# Patient Record
Sex: Female | Born: 1950 | Race: White | Hispanic: No | Marital: Married | Smoking: Never smoker
Health system: Southern US, Community
[De-identification: ages and names within clinical notes are randomized; demographics above are authoritative.]

## PROBLEM LIST (undated history)

## (undated) DIAGNOSIS — M8580 Other specified disorders of bone density and structure, unspecified site: Secondary | ICD-10-CM

## (undated) DIAGNOSIS — D649 Anemia, unspecified: Secondary | ICD-10-CM

## (undated) DIAGNOSIS — K219 Gastro-esophageal reflux disease without esophagitis: Secondary | ICD-10-CM

## (undated) DIAGNOSIS — E785 Hyperlipidemia, unspecified: Secondary | ICD-10-CM

## (undated) DIAGNOSIS — I6529 Occlusion and stenosis of unspecified carotid artery: Secondary | ICD-10-CM

## (undated) HISTORY — PX: TUBAL LIGATION: SHX77

## (undated) HISTORY — DX: Gastro-esophageal reflux disease without esophagitis: K21.9

## (undated) HISTORY — DX: Other specified disorders of bone density and structure, unspecified site: M85.80

## (undated) HISTORY — DX: Hyperlipidemia, unspecified: E78.5

## (undated) HISTORY — DX: Occlusion and stenosis of unspecified carotid artery: I65.29

## (undated) HISTORY — DX: Anemia, unspecified: D64.9

---

## 1998-05-03 ENCOUNTER — Other Ambulatory Visit: Admission: RE | Admit: 1998-05-03 | Discharge: 1998-05-03 | Payer: Self-pay | Admitting: Gynecology

## 1999-05-06 ENCOUNTER — Other Ambulatory Visit: Admission: RE | Admit: 1999-05-06 | Discharge: 1999-05-06 | Payer: Self-pay | Admitting: Gynecology

## 2000-04-02 ENCOUNTER — Encounter: Admission: RE | Admit: 2000-04-02 | Discharge: 2000-04-02 | Payer: Self-pay | Admitting: Gynecology

## 2000-04-02 ENCOUNTER — Encounter: Payer: Self-pay | Admitting: Gynecology

## 2000-07-14 ENCOUNTER — Other Ambulatory Visit: Admission: RE | Admit: 2000-07-14 | Discharge: 2000-07-14 | Payer: Self-pay | Admitting: Gynecology

## 2001-04-05 ENCOUNTER — Encounter: Payer: Self-pay | Admitting: Gynecology

## 2001-04-05 ENCOUNTER — Encounter: Admission: RE | Admit: 2001-04-05 | Discharge: 2001-04-05 | Payer: Self-pay | Admitting: Gynecology

## 2001-08-23 ENCOUNTER — Other Ambulatory Visit: Admission: RE | Admit: 2001-08-23 | Discharge: 2001-08-23 | Payer: Self-pay | Admitting: Gynecology

## 2002-03-15 ENCOUNTER — Encounter: Payer: Self-pay | Admitting: Internal Medicine

## 2002-03-15 ENCOUNTER — Ambulatory Visit (HOSPITAL_COMMUNITY): Admission: RE | Admit: 2002-03-15 | Discharge: 2002-03-15 | Payer: Self-pay | Admitting: Internal Medicine

## 2002-04-01 ENCOUNTER — Encounter: Admission: RE | Admit: 2002-04-01 | Discharge: 2002-04-01 | Payer: Self-pay | Admitting: Internal Medicine

## 2002-04-01 ENCOUNTER — Encounter: Payer: Self-pay | Admitting: Internal Medicine

## 2002-04-07 ENCOUNTER — Encounter: Payer: Self-pay | Admitting: Gynecology

## 2002-04-07 ENCOUNTER — Encounter: Admission: RE | Admit: 2002-04-07 | Discharge: 2002-04-07 | Payer: Self-pay | Admitting: Gynecology

## 2002-08-25 ENCOUNTER — Other Ambulatory Visit: Admission: RE | Admit: 2002-08-25 | Discharge: 2002-08-25 | Payer: Self-pay | Admitting: Gynecology

## 2003-02-28 ENCOUNTER — Other Ambulatory Visit: Admission: RE | Admit: 2003-02-28 | Discharge: 2003-02-28 | Payer: Self-pay | Admitting: Gynecology

## 2003-04-10 ENCOUNTER — Encounter: Admission: RE | Admit: 2003-04-10 | Discharge: 2003-04-10 | Payer: Self-pay | Admitting: Gynecology

## 2003-04-10 ENCOUNTER — Encounter: Payer: Self-pay | Admitting: Gynecology

## 2003-08-29 ENCOUNTER — Other Ambulatory Visit: Admission: RE | Admit: 2003-08-29 | Discharge: 2003-08-29 | Payer: Self-pay | Admitting: Gynecology

## 2004-04-10 ENCOUNTER — Encounter: Admission: RE | Admit: 2004-04-10 | Discharge: 2004-04-10 | Payer: Self-pay | Admitting: Gynecology

## 2004-07-30 ENCOUNTER — Ambulatory Visit: Payer: Self-pay | Admitting: Internal Medicine

## 2004-08-21 ENCOUNTER — Ambulatory Visit: Payer: Self-pay | Admitting: Internal Medicine

## 2004-08-29 ENCOUNTER — Other Ambulatory Visit: Admission: RE | Admit: 2004-08-29 | Discharge: 2004-08-29 | Payer: Self-pay | Admitting: Gynecology

## 2004-09-23 ENCOUNTER — Ambulatory Visit: Payer: Self-pay | Admitting: Internal Medicine

## 2004-09-27 ENCOUNTER — Ambulatory Visit: Payer: Self-pay | Admitting: Internal Medicine

## 2005-02-18 ENCOUNTER — Ambulatory Visit: Payer: Self-pay | Admitting: Internal Medicine

## 2005-02-25 ENCOUNTER — Ambulatory Visit: Payer: Self-pay | Admitting: Internal Medicine

## 2005-04-22 ENCOUNTER — Encounter: Admission: RE | Admit: 2005-04-22 | Discharge: 2005-04-22 | Payer: Self-pay | Admitting: Gynecology

## 2005-05-26 ENCOUNTER — Ambulatory Visit: Payer: Self-pay | Admitting: Internal Medicine

## 2005-06-16 ENCOUNTER — Ambulatory Visit: Payer: Self-pay | Admitting: Internal Medicine

## 2005-06-24 ENCOUNTER — Ambulatory Visit: Payer: Self-pay | Admitting: Internal Medicine

## 2005-07-02 ENCOUNTER — Ambulatory Visit: Payer: Self-pay | Admitting: Internal Medicine

## 2005-07-04 ENCOUNTER — Ambulatory Visit: Payer: Self-pay | Admitting: Internal Medicine

## 2005-08-27 ENCOUNTER — Ambulatory Visit: Payer: Self-pay | Admitting: Internal Medicine

## 2005-09-12 ENCOUNTER — Ambulatory Visit: Payer: Self-pay | Admitting: Internal Medicine

## 2005-09-15 ENCOUNTER — Other Ambulatory Visit: Admission: RE | Admit: 2005-09-15 | Discharge: 2005-09-15 | Payer: Self-pay | Admitting: Gynecology

## 2006-02-17 ENCOUNTER — Ambulatory Visit: Payer: Self-pay | Admitting: Internal Medicine

## 2006-02-25 ENCOUNTER — Ambulatory Visit: Payer: Self-pay | Admitting: Internal Medicine

## 2006-04-23 ENCOUNTER — Encounter: Admission: RE | Admit: 2006-04-23 | Discharge: 2006-04-23 | Payer: Self-pay | Admitting: Gynecology

## 2006-04-27 ENCOUNTER — Ambulatory Visit: Payer: Self-pay | Admitting: Internal Medicine

## 2006-06-18 ENCOUNTER — Ambulatory Visit: Payer: Self-pay | Admitting: Internal Medicine

## 2006-07-01 ENCOUNTER — Ambulatory Visit: Payer: Self-pay | Admitting: Internal Medicine

## 2006-09-23 ENCOUNTER — Ambulatory Visit: Payer: Self-pay | Admitting: Internal Medicine

## 2006-11-20 ENCOUNTER — Ambulatory Visit: Payer: Self-pay | Admitting: Internal Medicine

## 2007-02-24 ENCOUNTER — Ambulatory Visit: Payer: Self-pay | Admitting: Internal Medicine

## 2007-02-24 LAB — CONVERTED CEMR LAB
ALT: 35 units/L (ref 0–40)
Basophils Relative: 1 % (ref 0.0–1.0)
Bilirubin, Direct: 0.1 mg/dL (ref 0.0–0.3)
CO2: 29 meq/L (ref 19–32)
Creatinine, Ser: 0.6 mg/dL (ref 0.4–1.2)
Eosinophils Relative: 1.4 % (ref 0.0–5.0)
GFR calc Af Amer: 133 mL/min
Glucose, Bld: 94 mg/dL (ref 70–99)
HCT: 41.4 % (ref 36.0–46.0)
Hemoglobin: 14.1 g/dL (ref 12.0–15.0)
Leukocytes, UA: NEGATIVE
Lymphocytes Relative: 33.5 % (ref 12.0–46.0)
Monocytes Absolute: 0.4 10*3/uL (ref 0.2–0.7)
Neutro Abs: 3.1 10*3/uL (ref 1.4–7.7)
Nitrite: NEGATIVE
Potassium: 4 meq/L (ref 3.5–5.1)
RDW: 13.2 % (ref 11.5–14.6)
Specific Gravity, Urine: <=1.005 (ref 1.000–1.03)
Total Bilirubin: 0.6 mg/dL (ref 0.3–1.2)
Total Protein, Urine: NEGATIVE mg/dL
Total Protein: 6.8 g/dL (ref 6.0–8.3)
Triglycerides: 84 mg/dL (ref 0–149)
Urobilinogen, UA: 0.2 (ref 0.0–1.0)
WBC: 5.6 10*3/uL (ref 4.5–10.5)

## 2007-03-04 ENCOUNTER — Ambulatory Visit: Payer: Self-pay | Admitting: Internal Medicine

## 2007-03-10 ENCOUNTER — Ambulatory Visit: Payer: Self-pay

## 2007-05-03 ENCOUNTER — Encounter: Admission: RE | Admit: 2007-05-03 | Discharge: 2007-05-03 | Payer: Self-pay | Admitting: Gynecology

## 2007-07-01 ENCOUNTER — Ambulatory Visit: Payer: Self-pay | Admitting: Internal Medicine

## 2007-07-09 ENCOUNTER — Ambulatory Visit: Payer: Self-pay | Admitting: Internal Medicine

## 2007-07-27 ENCOUNTER — Encounter: Payer: Self-pay | Admitting: Internal Medicine

## 2007-07-27 DIAGNOSIS — J45909 Unspecified asthma, uncomplicated: Secondary | ICD-10-CM | POA: Insufficient documentation

## 2007-07-27 DIAGNOSIS — I6529 Occlusion and stenosis of unspecified carotid artery: Secondary | ICD-10-CM | POA: Insufficient documentation

## 2007-07-27 DIAGNOSIS — E785 Hyperlipidemia, unspecified: Secondary | ICD-10-CM | POA: Insufficient documentation

## 2007-07-27 DIAGNOSIS — M949 Disorder of cartilage, unspecified: Secondary | ICD-10-CM

## 2007-07-27 DIAGNOSIS — M899 Disorder of bone, unspecified: Secondary | ICD-10-CM | POA: Insufficient documentation

## 2007-08-31 ENCOUNTER — Ambulatory Visit: Payer: Self-pay | Admitting: Internal Medicine

## 2007-08-31 DIAGNOSIS — K219 Gastro-esophageal reflux disease without esophagitis: Secondary | ICD-10-CM | POA: Insufficient documentation

## 2007-09-01 ENCOUNTER — Telehealth: Payer: Self-pay | Admitting: Internal Medicine

## 2007-09-03 ENCOUNTER — Encounter: Payer: Self-pay | Admitting: Internal Medicine

## 2007-09-06 ENCOUNTER — Ambulatory Visit: Payer: Self-pay | Admitting: Internal Medicine

## 2007-09-11 ENCOUNTER — Encounter: Payer: Self-pay | Admitting: Internal Medicine

## 2007-09-14 ENCOUNTER — Ambulatory Visit: Payer: Self-pay | Admitting: Internal Medicine

## 2007-10-04 ENCOUNTER — Ambulatory Visit: Payer: Self-pay | Admitting: Internal Medicine

## 2007-10-04 DIAGNOSIS — J309 Allergic rhinitis, unspecified: Secondary | ICD-10-CM | POA: Insufficient documentation

## 2007-10-04 DIAGNOSIS — D649 Anemia, unspecified: Secondary | ICD-10-CM | POA: Insufficient documentation

## 2007-10-07 ENCOUNTER — Ambulatory Visit: Payer: Self-pay | Admitting: Internal Medicine

## 2007-10-08 ENCOUNTER — Ambulatory Visit: Payer: Self-pay | Admitting: Internal Medicine

## 2007-10-08 DIAGNOSIS — H6690 Otitis media, unspecified, unspecified ear: Secondary | ICD-10-CM | POA: Insufficient documentation

## 2007-10-12 ENCOUNTER — Telehealth: Payer: Self-pay | Admitting: Internal Medicine

## 2007-10-13 ENCOUNTER — Ambulatory Visit: Payer: Self-pay | Admitting: Internal Medicine

## 2007-10-27 ENCOUNTER — Ambulatory Visit: Payer: Self-pay | Admitting: Internal Medicine

## 2007-10-28 ENCOUNTER — Telehealth: Payer: Self-pay | Admitting: Internal Medicine

## 2007-11-03 DIAGNOSIS — S43429A Sprain of unspecified rotator cuff capsule, initial encounter: Secondary | ICD-10-CM | POA: Insufficient documentation

## 2007-11-03 DIAGNOSIS — K649 Unspecified hemorrhoids: Secondary | ICD-10-CM | POA: Insufficient documentation

## 2007-11-03 DIAGNOSIS — R519 Headache, unspecified: Secondary | ICD-10-CM | POA: Insufficient documentation

## 2007-11-03 DIAGNOSIS — M1990 Unspecified osteoarthritis, unspecified site: Secondary | ICD-10-CM | POA: Insufficient documentation

## 2007-11-03 DIAGNOSIS — K222 Esophageal obstruction: Secondary | ICD-10-CM | POA: Insufficient documentation

## 2007-11-03 DIAGNOSIS — R51 Headache: Secondary | ICD-10-CM | POA: Insufficient documentation

## 2008-02-22 ENCOUNTER — Ambulatory Visit: Payer: Self-pay | Admitting: Internal Medicine

## 2008-02-22 LAB — CONVERTED CEMR LAB: Vit D, 1,25-Dihydroxy: 30 (ref 30–89)

## 2008-02-26 LAB — CONVERTED CEMR LAB
ALT: 23 units/L (ref 0–35)
AST: 23 units/L (ref 0–37)
BUN: 13 mg/dL (ref 6–23)
Basophils Absolute: 0.1 10*3/uL (ref 0.0–0.1)
Basophils Relative: 0.9 % (ref 0.0–1.0)
CO2: 29 meq/L (ref 19–32)
Calcium: 9.5 mg/dL (ref 8.4–10.5)
Chloride: 105 meq/L (ref 96–112)
Cholesterol: 191 mg/dL (ref 0–200)
Creatinine, Ser: 0.7 mg/dL (ref 0.4–1.2)
Eosinophils Relative: 1.4 % (ref 0.0–5.0)
Glucose, Bld: 100 mg/dL — ABNORMAL HIGH (ref 70–99)
Hemoglobin: 14.4 g/dL (ref 12.0–15.0)
Ketones, ur: NEGATIVE mg/dL
LDL Cholesterol: 116 mg/dL — ABNORMAL HIGH (ref 0–99)
Leukocytes, UA: NEGATIVE
Lymphocytes Relative: 32.6 % (ref 12.0–46.0)
MCHC: 33.1 g/dL (ref 30.0–36.0)
Monocytes Relative: 7.3 % (ref 3.0–12.0)
Neutro Abs: 3.4 10*3/uL (ref 1.4–7.7)
Neutrophils Relative %: 57.8 % (ref 43.0–77.0)
RBC: 4.89 M/uL (ref 3.87–5.11)
Specific Gravity, Urine: <=1.005 (ref 1.000–1.03)
TSH: 1.87 microintl units/mL (ref 0.35–5.50)
Total Bilirubin: 0.8 mg/dL (ref 0.3–1.2)
Total CHOL/HDL Ratio: 3.4
Total Protein: 7.3 g/dL (ref 6.0–8.3)
Triglycerides: 97 mg/dL (ref 0–149)
Urobilinogen, UA: 0.2 (ref 0.0–1.0)
WBC: 5.9 10*3/uL (ref 4.5–10.5)
pH: 7.5 (ref 5.0–8.0)

## 2008-02-29 ENCOUNTER — Ambulatory Visit: Payer: Self-pay | Admitting: Internal Medicine

## 2008-03-30 ENCOUNTER — Ambulatory Visit: Payer: Self-pay | Admitting: Internal Medicine

## 2008-03-30 DIAGNOSIS — M542 Cervicalgia: Secondary | ICD-10-CM | POA: Insufficient documentation

## 2008-04-19 ENCOUNTER — Telehealth: Payer: Self-pay | Admitting: Internal Medicine

## 2008-05-04 ENCOUNTER — Telehealth: Payer: Self-pay | Admitting: Internal Medicine

## 2008-05-04 ENCOUNTER — Encounter: Admission: RE | Admit: 2008-05-04 | Discharge: 2008-05-04 | Payer: Self-pay | Admitting: Gynecology

## 2008-05-08 ENCOUNTER — Ambulatory Visit: Payer: Self-pay | Admitting: Internal Medicine

## 2008-05-08 DIAGNOSIS — G4700 Insomnia, unspecified: Secondary | ICD-10-CM | POA: Insufficient documentation

## 2008-05-08 DIAGNOSIS — T23059A Burn of unspecified degree of unspecified palm, initial encounter: Secondary | ICD-10-CM | POA: Insufficient documentation

## 2008-06-15 ENCOUNTER — Ambulatory Visit: Payer: Self-pay | Admitting: Internal Medicine

## 2008-06-15 DIAGNOSIS — L259 Unspecified contact dermatitis, unspecified cause: Secondary | ICD-10-CM | POA: Insufficient documentation

## 2008-06-15 DIAGNOSIS — L298 Other pruritus: Secondary | ICD-10-CM | POA: Insufficient documentation

## 2008-06-23 ENCOUNTER — Ambulatory Visit: Payer: Self-pay | Admitting: Internal Medicine

## 2008-07-21 ENCOUNTER — Ambulatory Visit: Payer: Self-pay | Admitting: Internal Medicine

## 2008-07-21 DIAGNOSIS — J069 Acute upper respiratory infection, unspecified: Secondary | ICD-10-CM | POA: Insufficient documentation

## 2008-07-21 DIAGNOSIS — R42 Dizziness and giddiness: Secondary | ICD-10-CM | POA: Insufficient documentation

## 2008-08-14 ENCOUNTER — Encounter: Payer: Self-pay | Admitting: Internal Medicine

## 2008-08-18 ENCOUNTER — Ambulatory Visit: Payer: Self-pay | Admitting: Internal Medicine

## 2008-09-13 ENCOUNTER — Ambulatory Visit: Payer: Self-pay | Admitting: Internal Medicine

## 2008-09-25 ENCOUNTER — Ambulatory Visit: Payer: Self-pay | Admitting: Internal Medicine

## 2008-09-26 ENCOUNTER — Encounter: Payer: Self-pay | Admitting: Internal Medicine

## 2008-10-02 ENCOUNTER — Telehealth: Payer: Self-pay | Admitting: Internal Medicine

## 2008-10-05 ENCOUNTER — Ambulatory Visit (HOSPITAL_COMMUNITY): Admission: RE | Admit: 2008-10-05 | Discharge: 2008-10-05 | Payer: Self-pay | Admitting: Internal Medicine

## 2009-02-21 ENCOUNTER — Ambulatory Visit: Payer: Self-pay | Admitting: Internal Medicine

## 2009-02-21 LAB — CONVERTED CEMR LAB
AST: 22 units/L (ref 0–37)
Albumin: 4 g/dL (ref 3.5–5.2)
Alkaline Phosphatase: 41 units/L (ref 39–117)
BUN: 10 mg/dL (ref 6–23)
Basophils Relative: 0 % (ref 0.0–3.0)
Bilirubin Urine: NEGATIVE
Calcium: 9.4 mg/dL (ref 8.4–10.5)
Chloride: 104 meq/L (ref 96–112)
Cholesterol: 173 mg/dL (ref 0–200)
Creatinine, Ser: 0.8 mg/dL (ref 0.4–1.2)
HCT: 40 % (ref 36.0–46.0)
Hemoglobin, Urine: NEGATIVE
Hemoglobin: 14 g/dL (ref 12.0–15.0)
Ketones, ur: NEGATIVE mg/dL
LDL Cholesterol: 96 mg/dL (ref 0–99)
Lymphocytes Relative: 37.1 % (ref 12.0–46.0)
Lymphs Abs: 1.8 10*3/uL (ref 0.7–4.0)
MCHC: 34.9 g/dL (ref 30.0–36.0)
Monocytes Relative: 8.4 % (ref 3.0–12.0)
Neutro Abs: 2.6 10*3/uL (ref 1.4–7.7)
RBC: 4.59 M/uL (ref 3.87–5.11)
Total CHOL/HDL Ratio: 3
Total Protein: 7.2 g/dL (ref 6.0–8.3)
Urine Glucose: NEGATIVE mg/dL
Urobilinogen, UA: 0.2 (ref 0.0–1.0)

## 2009-03-01 ENCOUNTER — Ambulatory Visit: Payer: Self-pay | Admitting: Internal Medicine

## 2009-05-11 ENCOUNTER — Encounter: Admission: RE | Admit: 2009-05-11 | Discharge: 2009-05-11 | Payer: Self-pay | Admitting: Gynecology

## 2009-05-31 ENCOUNTER — Encounter (INDEPENDENT_AMBULATORY_CARE_PROVIDER_SITE_OTHER): Payer: Self-pay | Admitting: *Deleted

## 2009-06-07 ENCOUNTER — Ambulatory Visit: Payer: Self-pay | Admitting: Internal Medicine

## 2009-07-27 ENCOUNTER — Encounter: Payer: Self-pay | Admitting: Internal Medicine

## 2009-08-14 ENCOUNTER — Encounter: Payer: Self-pay | Admitting: Internal Medicine

## 2009-10-16 ENCOUNTER — Ambulatory Visit: Payer: Self-pay | Admitting: Internal Medicine

## 2009-10-16 DIAGNOSIS — J209 Acute bronchitis, unspecified: Secondary | ICD-10-CM | POA: Insufficient documentation

## 2009-11-29 ENCOUNTER — Encounter: Payer: Self-pay | Admitting: Internal Medicine

## 2009-11-30 ENCOUNTER — Encounter: Payer: Self-pay | Admitting: Internal Medicine

## 2009-11-30 ENCOUNTER — Ambulatory Visit: Payer: Self-pay

## 2009-12-03 ENCOUNTER — Encounter: Payer: Self-pay | Admitting: Internal Medicine

## 2009-12-03 ENCOUNTER — Ambulatory Visit: Payer: Self-pay | Admitting: Internal Medicine

## 2010-01-29 ENCOUNTER — Telehealth (INDEPENDENT_AMBULATORY_CARE_PROVIDER_SITE_OTHER): Payer: Self-pay | Admitting: *Deleted

## 2010-02-15 ENCOUNTER — Encounter: Payer: Self-pay | Admitting: Internal Medicine

## 2010-02-18 ENCOUNTER — Ambulatory Visit: Payer: Self-pay | Admitting: Internal Medicine

## 2010-02-18 LAB — CONVERTED CEMR LAB
ALT: 18 units/L (ref 0–35)
Alkaline Phosphatase: 37 units/L — ABNORMAL LOW (ref 39–117)
BUN: 13 mg/dL (ref 6–23)
Basophils Relative: 0.8 % (ref 0.0–3.0)
Bilirubin Urine: NEGATIVE
Bilirubin, Direct: 0.1 mg/dL (ref 0.0–0.3)
Calcium: 9.3 mg/dL (ref 8.4–10.5)
Creatinine, Ser: 0.7 mg/dL (ref 0.4–1.2)
Eosinophils Relative: 1.1 % (ref 0.0–5.0)
GFR calc non Af Amer: 95.45 mL/min (ref >60)
Glucose, Bld: 89 mg/dL (ref 70–99)
Hemoglobin, Urine: NEGATIVE
MCV: 87.8 fL (ref 78.0–100.0)
Monocytes Absolute: 0.4 10*3/uL (ref 0.1–1.0)
Neutrophils Relative %: 60.4 % (ref 43.0–77.0)
RBC: 4.67 M/uL (ref 3.87–5.11)
Total Protein, Urine: NEGATIVE mg/dL
Total Protein: 6.7 g/dL (ref 6.0–8.3)
Urine Glucose: NEGATIVE mg/dL
VLDL: 20.6 mg/dL (ref 0.0–40.0)
WBC: 6.2 10*3/uL (ref 4.5–10.5)

## 2010-02-25 ENCOUNTER — Ambulatory Visit: Payer: Self-pay | Admitting: Internal Medicine

## 2010-05-14 ENCOUNTER — Encounter: Admission: RE | Admit: 2010-05-14 | Discharge: 2010-05-14 | Payer: Self-pay | Admitting: Gynecology

## 2010-05-14 LAB — HM MAMMOGRAPHY

## 2010-05-27 ENCOUNTER — Ambulatory Visit: Payer: Self-pay | Admitting: Internal Medicine

## 2010-07-22 ENCOUNTER — Ambulatory Visit: Payer: Self-pay | Admitting: Internal Medicine

## 2010-08-12 ENCOUNTER — Encounter: Payer: Self-pay | Admitting: Internal Medicine

## 2010-10-08 NOTE — Miscellaneous (Signed)
Summary: Health Care POA  Health Care POA   Imported By: Sherian Rein 02/26/2010 10:30:15  _____________________________________________________________________  External Attachment:    Type:   Image     Comment:   External Document

## 2010-10-08 NOTE — Miscellaneous (Signed)
Summary: Orders Update  Clinical Lists Changes  Orders: Added new Test order of Carotid Duplex (Carotid Duplex) - Signed 

## 2010-10-08 NOTE — Letter (Signed)
Summary: Letter to CNA/CNA letter/Patient email concerning CNA  Letter to CNA/CNA letter/Patient email concerning CNA   Imported By: Maryln Gottron 09/07/2007 09:59:37  _____________________________________________________________________  External Attachment:    Type:   Image     Comment:   External Document
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
## 2010-10-08 NOTE — Letter (Signed)
Summary: Generic Letter  [facility]  [address] [place], [place] [address]   Phone: [phone]  Fax: [phone]    09/03/2007  To: Administrator CNA FAX [phone]  NG:[id number] Bober [address] [place], [place]  [address]  DOB: 07/12/1951 [phone]  To whome it may concern:  [name] [name] Has been a patient of mine for a number of year. She is very healthy for her age. She is being treated preventitively for a mild bone loss (osteopenia) and elevated cholesterol with a very mild case of carotid atherosclerosis (age related hardening of neck blood vessels).  A majority of women of her age would have the same problems and choose not to address them. [name] [name] is very much into Preventive Care and takes medicines to prevent future problems. That is why those two problems cought your attention.  Her asthma has been very well controlled.  Her neck/arm pain and her vertigo have resolved.  In my opinion, she is in a very good state of health for her age and has no more than average  risk for functional or cognitive impairment in the future.  Please, review her case.             Sincerely,   Jacinta Shoe MD [facility]

## 2010-10-08 NOTE — Miscellaneous (Signed)
Summary: [facility] Previsit/prep  Clinical Lists Changes  Medications: Added new medication of MOVIPREP 100 GM  SOLR (PEG-KCL-NACL-NASULF-NA ASC-C) As per prep instructions. - Signed Rx of MOVIPREP 100 GM  SOLR (PEG-KCL-NACL-NASULF-NA ASC-C) As per prep instructions.;  #1 x 0;  Signed;  Entered by: Wyona Almas RN;  Authorized by: Hilarie Fredrickson MD;  Method used: Electronically to [facility]*, [address] [place], [place]  [address], Ph: [phone] or [phone], Fax: [phone]    Prescriptions: MOVIPREP 100 GM  SOLR (PEG-KCL-NACL-NASULF-NA ASC-C) As per prep instructions.  #1 x 0   Entered by:   Wyona Almas RN   Authorized by:   Hilarie Fredrickson MD   Signed by:   Wyona Almas RN on 09/13/2008   Method used:   Electronically to        [facility]* (retail)       [address] [place], [place]  [address]       Ph: [phone] or [phone]       Fax: [phone]   RxID:   [id number]   Appended Document: [facility] Previsit/Prep    Clinical Lists Changes  Observations: Added new observation of ALLERGY REV: Done (09/13/2008 8:53)

## 2010-10-08 NOTE — Assessment & Plan Note (Signed)
Summary: COUGH,DRAINAGE/IF FEVER WILL GO TO SIDE DOOR/CD   Vital Signs:  Patient profile:   60 year old female Weight:      149 pounds Temp:     98.7 degrees F oral Pulse rate:   92 / minute BP sitting:   112 / 90  (left arm)  Vitals Entered By: Tora Perches (October 16, 2009 3:44 PM) CC: cough, fever Is Patient Diabetic? No   CC:  cough and fever.  History of Present Illness: The patient presents with complaints of sore throat, fever, cough, sinus congestion and drainge of several days duration. Not better with OTC meds. Chest hurts with coughing. Can't sleep due to cough. Muscle aches are present.  The mucus is colored. Saw her allergist yesturday. had a cortsone shot  Preventive Screening-Counseling & Management  Alcohol-Tobacco     Smoking Status: never  Current Medications (verified): 1)  Nasonex 50 Mcg/act  Susp (Mometasone Furoate) .... Use As Directed 2)  Advair Diskus 100-50 Mcg/dose  Misc (Fluticasone-Salmeterol) .... Use As Directed 3)  Albuterol 90 Mcg/act  Aers (Albuterol) .... Use Orn 4)  Omeprazole 20 Mg Cpdr (Omeprazole) .... Take 2 Tabs Daily 5)  Vitamin D3 1000 Unit  Tabs (Cholecalciferol) .Marland Kitchen.. 1 By Mouth Daily 6)  Fioricet 50-325-40 Mg  Tabs (Butalbital-Apap-Caffeine) .Marland Kitchen.. 1 By Mouth Qid Prn 7)  Calcium 600/vitamin D 600-400 Mg-Unit Chew (Calcium Carbonate-Vitamin D) .... Once Daily 8)  Claritin 10 Mg Caps (Loratadine) .... Once Daily 9)  Lipitor 10 Mg Tabs (Atorvastatin Calcium) .Marland Kitchen.. 1 By Mouth Daily 10)  Benefiber  Powd (Wheat Dextrin) .... 2 Tsp. Once Daily  Allergies: 1)  ! * Topical Aristocort 2)  Fosamax 3)  Augmentin  Past History:  Past Medical History: Last updated: 03/01/2009 Asthma Hyperlipidemia Osteopenia Very mild carotid stenosis GERD Allergic rhinitis Anemia-NOS migraine GYN DR Greta Doom  Social History: Last updated: 10/04/2007 Retired Married 2 children Never Smoked  Family History: Reviewed history from 10/04/2007  and no changes required. father with chest cancer at 59 yo mother with HTN  Social History: Reviewed history from 10/04/2007 and no changes required. Retired Married 2 children Never Smoked  Physical Exam  General:  Well-developed,well-nourished,in no acute distress; alert,appropriate and cooperative throughout examination Mouth:  Erythematous throat mucosa and intranasal erythema.  Lungs:  Normal respiratory effort, chest expands symmetrically. Lungs are clear to auscultation, no crackles or wheezes. Heart:  Normal rate and regular rhythm. S1 and S2 normal without gallop, murmur, click, rub or other extra sounds. Abdomen:  Bowel sounds positive,abdomen soft and non-tender without masses, organomegaly or hernias noted.   Impression & Recommendations:  Problem # 1:  BRONCHITIS, ACUTE (ICD-466.0) Assessment New See "Patient Instructions".  Her updated medication list for this problem includes:    Advair Diskus 100-50 Mcg/dose Misc (Fluticasone-salmeterol) ..... Use as directed    Albuterol 90 Mcg/act Aers (Albuterol) ..... Use orn    Tussicaps 10-8 Mg Xr12h-cap (Hydrocod polst-chlorphen polst) .Marland Kitchen... 1 by mouth two times a day as needed cough  Problem # 2:  ASTHMA (ICD-493.90) Assessment: Deteriorated  Her updated medication list for this problem includes:    Advair Diskus 100-50 Mcg/dose Misc (Fluticasone-salmeterol) ..... Use as directed    Albuterol 90 Mcg/act Aers (Albuterol) ..... Use orn  Complete Medication List: 1)  Nasonex 50 Mcg/act Susp (Mometasone furoate) .... Use as directed 2)  Advair Diskus 100-50 Mcg/dose Misc (Fluticasone-salmeterol) .... Use as directed 3)  Albuterol 90 Mcg/act Aers (Albuterol) .... Use orn 4)  Omeprazole 20  Mg Cpdr (Omeprazole) .... Take 2 tabs daily 5)  Vitamin D3 1000 Unit Tabs (Cholecalciferol) .Marland Kitchen.. 1 by mouth daily 6)  Fioricet 50-325-40 Mg Tabs (Butalbital-apap-caffeine) .Marland Kitchen.. 1 by mouth qid prn 7)  Calcium 600/vitamin D 600-400  Mg-unit Chew (Calcium carbonate-vitamin d) .... Once daily 8)  Claritin 10 Mg Caps (Loratadine) .... Once daily 9)  Lipitor 10 Mg Tabs (Atorvastatin calcium) .Marland Kitchen.. 1 by mouth daily 10)  Benefiber Powd (Wheat dextrin) .... 2 tsp. once daily 11)  Tussicaps 10-8 Mg Xr12h-cap (Hydrocod polst-chlorphen polst) .Marland Kitchen.. 1 by mouth two times a day as needed cough  Patient Instructions: 1)  Use over-the-counter medicines for "cold": Tylenol  650mg  or Advil 400mg  every 6 hours  for fever; Delsym or Robutussin for cough. Mucinex or Mucinex D for congestion. Ricola or Halls for sore throat. Office visit if not better or if worse. 2)  Voice rest Prescriptions: ZITHROMAX Z-PAK 250 MG TABS (AZITHROMYCIN) as dirrected  #1 x 0   Entered and Authorized by:   Tresa Garter MD   Signed by:   Tresa Garter MD on 10/16/2009   Method used:   Print then Give to Patient   RxID:   [id number] TUSSICAPS 10-8 MG XR12H-CAP (HYDROCOD POLST-CHLORPHEN POLST) 1 by mouth two times a day as needed cough  #20 x 0   Entered and Authorized by:   Tresa Garter MD   Signed by:   Tresa Garter MD on 10/16/2009   Method used:   Print then Give to Patient   RxID:   [id number]

## 2010-10-08 NOTE — Assessment & Plan Note (Signed)
Summary: PHYSICAL  STC   Vital Signs:  Patient profile:   60 year old female Height:      66 inches (167.64 cm) Weight:      144.25 pounds (65.57 kg) BMI:     23.37 O2 Sat:      99 % on Room air Temp:     98.0 degrees F (36.67 degrees C) oral Pulse rate:   88 / minute BP sitting:   130 / 72  (left arm) Cuff size:   regular  Vitals Entered By: Lucious Groves (February 25, 2010 8:34 AM)  O2 Flow:  Room air CC: CPX./kb Is Patient Diabetic? No Pain Assessment Patient in pain? no        CC:  CPX./kb.  History of Present Illness: The patient presents for a wellness examination   Current Medications (verified): 1)  Nasonex 50 Mcg/act  Susp (Mometasone Furoate) .... Use As Directed 2)  Advair Diskus 100-50 Mcg/dose  Misc (Fluticasone-Salmeterol) .... Use As Directed 3)  Albuterol 90 Mcg/act  Aers (Albuterol) .... Use Orn 4)  Omeprazole 20 Mg Cpdr (Omeprazole) .... Take 2 Tabs Daily 5)  Vitamin D3 1000 Unit  Tabs (Cholecalciferol) .Marland Kitchen.. 1 By Mouth Daily 6)  Fioricet 50-325-40 Mg  Tabs (Butalbital-Apap-Caffeine) .Marland Kitchen.. 1 By Mouth Qid Prn 7)  Calcium 600/vitamin D 600-400 Mg-Unit Chew (Calcium Carbonate-Vitamin D) .... Once Daily 8)  Claritin 10 Mg Caps (Loratadine) .... Once Daily 9)  Lipitor 10 Mg Tabs (Atorvastatin Calcium) .Marland Kitchen.. 1 By Mouth Daily 10)  Benefiber  Powd (Wheat Dextrin) .... 2 Tsp. Once Daily  Allergies (verified): 1)  ! * Topical Aristocort 2)  Fosamax 3)  Augmentin  Past History:  Past Surgical History: Last updated: 10/04/2007 Tubal ligation  Family History: Last updated: 10/04/2007 father with chest cancer at 69 yo mother with HTN  Social History: Last updated: 10/04/2007 Retired Married 2 children Never Smoked  Past Medical History: Asthma Hyperlipidemia Osteopenia Very mild carotid stenosis GERD Dr Marina Goodell Allergic rhinitis Anemia-NOS migraine GYN DR Greta Doom  Review of Systems  The patient denies anorexia, fever, weight loss, weight gain,  vision loss, decreased hearing, hoarseness, chest pain, syncope, dyspnea on exertion, peripheral edema, prolonged cough, headaches, hemoptysis, abdominal pain, melena, hematochezia, severe indigestion/heartburn, hematuria, incontinence, genital sores, muscle weakness, suspicious skin lesions, transient blindness, difficulty walking, depression, unusual weight change, abnormal bleeding, enlarged lymph nodes, angioedema, and breast masses.    Physical Exam  General:  Well-developed,well-nourished,in no acute distress; alert,appropriate and cooperative throughout examination Head:  Normocephalic and atraumatic without obvious abnormalities. No apparent alopecia or balding. Eyes:  No corneal or conjunctival inflammation noted. EOMI. Perrla. Funduscopic exam benign, without hemorrhages, exudates or papilledema. Vision grossly normal. Ears:  External ear exam shows no significant lesions or deformities.  Otoscopic examination reveals clear canals, tympanic membranes are intact bilaterally without bulging, retraction, inflammation or discharge. Hearing is grossly normal bilaterally. Nose:  External nasal examination shows no deformity or inflammation. Nasal mucosa are pink and moist without lesions or exudates. Mouth:  Oral mucosa and oropharynx without lesions or exudates.  Teeth in good repair. Neck:  No deformities, masses, or tenderness noted. Chest Wall:  No deformities, masses, or tenderness noted. Lungs:  Normal respiratory effort, chest expands symmetrically. Lungs are clear to auscultation, no crackles or wheezes. Heart:  Normal rate and regular rhythm. S1 and S2 normal without gallop, murmur, click, rub or other extra sounds. Abdomen:  Bowel sounds positive,abdomen soft and non-tender without masses, organomegaly or hernias noted. Msk:  No deformity or scoliosis noted of thoracic or lumbar spine.   Pulses:  R and L carotid,radial,femoral,dorsalis pedis and posterior tibial pulses are full and  equal bilaterally Extremities:  No clubbing, cyanosis, edema, or deformity noted with normal full range of motion of all joints.   Neurologic:  No cranial nerve deficits noted. Station and gait are normal. Plantar reflexes are down-going bilaterally. DTRs are symmetrical throughout. Sensory, motor and coordinative functions appear intact. Skin:  Intact without suspicious lesions or rashes Cervical Nodes:  No lymphadenopathy noted Inguinal Nodes:  No significant adenopathy Psych:  Cognition and judgment appear intact. Alert and cooperative with normal attention span and concentration. No apparent delusions, illusions, hallucinations   Impression & Recommendations:  Problem # 1:  WELL ADULT EXAM (ICD-V70.0) Assessment New Colonosc up to date Orders: EKG w/ Interpretation (93000) Health and age related issues were discussed. Available screening tests and vaccinations were discussed as well. Healthy life style including good diet and execise was discussed.  The labs were reviewed with the patient.   Problem # 2:  HEADACHE (ICD-784.0) Assessment: Unchanged  Her updated medication list for this problem includes:    Fioricet 50-325-40 Mg Tabs (Butalbital-apap-caffeine) .Marland Kitchen... 1 by mouth qid prn  Problem # 3:  ASTHMA (ICD-493.90) Assessment: Unchanged  Her updated medication list for this problem includes:    Advair Diskus 100-50 Mcg/dose Misc (Fluticasone-salmeterol) ..... Use as directed    Albuterol 90 Mcg/act Aers (Albuterol) ..... Use orn  Problem # 4:  OSTEOARTHRITIS (ICD-715.90) Assessment: New Pennsaid Her updated medication list for this problem includes:    Fioricet 50-325-40 Mg Tabs (Butalbital-apap-caffeine) .Marland Kitchen... 1 by mouth qid prn  Complete Medication List: 1)  Nasonex 50 Mcg/act Susp (Mometasone furoate) .... Use as directed 2)  Advair Diskus 100-50 Mcg/dose Misc (Fluticasone-salmeterol) .... Use as directed 3)  Albuterol 90 Mcg/act Aers (Albuterol) .... Use orn 4)   Omeprazole 20 Mg Cpdr (Omeprazole) .... Take 2 tabs daily 5)  Vitamin D3 1000 Unit Tabs (Cholecalciferol) .Marland Kitchen.. 1 by mouth daily 6)  Fioricet 50-325-40 Mg Tabs (Butalbital-apap-caffeine) .Marland Kitchen.. 1 by mouth qid prn 7)  Calcium 600/vitamin D 600-400 Mg-unit Chew (Calcium carbonate-vitamin d) .... Once daily 8)  Claritin 10 Mg Caps (Loratadine) .... Once daily 9)  Lipitor 10 Mg Tabs (Atorvastatin calcium) .Marland Kitchen.. 1 by mouth daily 10)  Benefiber Powd (Wheat dextrin) .... 2 tsp. once daily 11)  Pennsaid 1.5 % Soln (Diclofenac sodium) .... 3-5 gtt on skin three times a day for pain  Patient Instructions: 1)  Please schedule a follow-up appointment in 6 months. 2)  BMP prior to visit, ICD-9: 3)  Lipid Panel prior to visit, ICD-9: Prescriptions: PENNSAID 1.5 % SOLN (DICLOFENAC SODIUM) 3-5 gtt on skin three times a day for pain  #1 x 3   Entered and Authorized by:   Tresa Garter MD   Signed by:   Tresa Garter MD on 02/25/2010   Method used:   Print then Give to Patient   RxID:   [id number] LIPITOR 10 MG TABS (ATORVASTATIN CALCIUM) 1 by mouth daily  #30 x 12   Entered and Authorized by:   Tresa Garter MD   Signed by:   Tresa Garter MD on 02/25/2010   Method used:   Print then Give to Patient   RxID:   [id number] FIORICET 50-325-40 MG  TABS (BUTALBITAL-APAP-CAFFEINE) 1 by mouth qid prn  #60 x 2   Entered and Authorized by:   Tresa Garter MD  Signed by:   Tresa Garter MD on 02/25/2010   Method used:   Print then Give to Patient   RxID:   [id number] OMEPRAZOLE 20 MG CPDR (OMEPRAZOLE) Take 2 tabs daily  #60 x 12   Entered and Authorized by:   Tresa Garter MD   Signed by:   Tresa Garter MD on 02/25/2010   Method used:   Print then Give to Patient   RxID:   [id number]

## 2010-10-08 NOTE — Letter (Signed)
Summary: [facility]  [facility]   Imported By: Sherian Rein 03/13/2010 12:18:15  _____________________________________________________________________  External Attachment:    Type:   Image     Comment:   External Document

## 2010-10-08 NOTE — Progress Notes (Signed)
Summary: Schedule recall colonoscopy   Phone Note Outgoing Call Call back at [facility] Phone [phone]   Call placed by: Christie Nottingham CMA Duncan Dull),  Jan 29, 2010 2:03 PM Call placed to: Patient Summary of Call: Called pt to schedule recall colonoscopy and left a message with spouse to call our office back to schedule.  Initial call taken by: Christie Nottingham CMA Duncan Dull),  Jan 29, 2010 2:04 PM  Follow-up for Phone Call        Pt states she had a procedure in January of 2010 and from there she was told she would not have to have another procedure for a while. I informed pt that according to her paper chart she was due for another procedure and then when you look in her electronic chart following her last colonoscopy, she is not due for another proceduer for 10 years. I told her that this was a misunderstanding and will contact her when she is due for her next proecdure.  Follow-up by: Christie Nottingham CMA Duncan Dull),  Jan 29, 2010 3:29 PM

## 2010-10-08 NOTE — Assessment & Plan Note (Signed)
Summary: SINUS/ SORE THROAT /AVP'S PT/NWS   Vital Signs:  Patient profile:   60 year old female Height:      66 inches (167.64 cm) Weight:      144 pounds (65.45 kg) O2 Sat:      97 % on Room air Temp:     98.5 degrees F (36.94 degrees C) oral Pulse rate:   83 / minute BP sitting:   132 / 72  (left arm) Cuff size:   regular  Vitals Entered By: Orlan Leavens RMA (July 22, 2010 1:10 PM)  O2 Flow:  Room air CC: sinus & sore throat x's 3-4 days, URI symptoms Is Patient Diabetic? No Pain Assessment Patient in pain? no        Primary Care Provider:  Georgina Quint Plotnikov MD  CC:  sinus & sore throat x's 3-4 days and URI symptoms.  History of Present Illness:  URI Symptoms      This is a 60 year old woman who presents with URI symptoms.  The symptoms began 4 days ago.  The severity is described as moderate.  hx similar to prior sinus infections.  The patient reports nasal congestion, sore throat, and earache, but denies dry cough, productive cough, and sick contacts.  The patient denies fever, dyspnea, wheezing, rash, vomiting, and diarrhea.  The patient also reports itchy watery eyes, sneezing, and seasonal symptoms.  The patient denies muscle aches and severe fatigue.  Risk factors for Strep sinusitis include absence of cough.  The patient denies the following risk factors for Strep sinusitis: tooth pain, Strep exposure, and tender adenopathy.    Current Medications (verified): 1)  Nasonex 50 Mcg/act  Susp (Mometasone Furoate) .... Use As Directed 2)  Advair Diskus 100-50 Mcg/dose  Misc (Fluticasone-Salmeterol) .... Use As Directed 3)  Albuterol 90 Mcg/act  Aers (Albuterol) .... Use Orn 4)  Omeprazole 20 Mg Cpdr (Omeprazole) .... Take 2 Tabs Daily 5)  Vitamin D3 1000 Unit  Tabs (Cholecalciferol) .Marland Kitchen.. 1 By Mouth Daily 6)  Fioricet 50-325-40 Mg  Tabs (Butalbital-Apap-Caffeine) .Marland Kitchen.. 1 By Mouth Qid Prn 7)  Calcium 600/vitamin D 600-400 Mg-Unit Chew (Calcium Carbonate-Vitamin D) ....  Once Daily 8)  Claritin 10 Mg Caps (Loratadine) .... Once Daily 9)  Lipitor 10 Mg Tabs (Atorvastatin Calcium) .Marland Kitchen.. 1 By Mouth Daily 10)  Benefiber  Powd (Wheat Dextrin) .... 2 Tsp. Once Daily  Allergies (verified): 1)  ! * Topical Aristocort 2)  Fosamax 3)  Augmentin  Past History:  Past Medical History: Asthma Hyperlipidemia Osteopenia Very mild carotid stenosis GERD Dr Marina Goodell  Allergic rhinitis Anemia-NOS migraine GYN DR Greta Doom  Review of Systems  The patient denies weight loss, headaches, hemoptysis, and abdominal pain.    Physical Exam  General:  alert, well-developed, well-nourished, and cooperative to examination.    Head:  Normocephalic and atraumatic without obvious abnormalities. No apparent alopecia or balding. Eyes:  vision grossly intact; pupils equal, round and reactive to light.  conjunctiva and lids normal.    Ears:  normal pinnae bilaterally, without erythema, swelling, or tenderness to palpation. TMs pink, mod effusion, no cerumen impaction. Hearing grossly normal bilaterally  Mouth:  teeth and gums in good repair; mucous membranes moist, without lesions or ulcers. oropharynx clear without exudate, mod erythema. +white PND Lungs:  normal respiratory effort, no intercostal retractions or use of accessory muscles; normal breath sounds bilaterally - no crackles and no wheezes.    Heart:  normal rate, regular rhythm, no murmur, and no rub. BLE  without edema.   Impression & Recommendations:  Problem # 1:  OTITIS MEDIA (ICD-382.9)  azith +decongestants - resume nasal steroid for tx sinus symptoms underlying ear problems Her updated medication list for this problem includes:    Azithromycin 250 Mg Tabs (Azithromycin) .Marland Kitchen... 2 tabs by mouth today, then 1 by mouth daily starting tomorrow  Instructed on prevention and treatment. Call if no improvement in 48-72 hours or sooner if worsening symptoms.   Complete Medication List: 1)  Nasonex 50 Mcg/act Susp  (Mometasone furoate) .... Use as directed 2)  Advair Diskus 100-50 Mcg/dose Misc (Fluticasone-salmeterol) .... Use as directed 3)  Albuterol 90 Mcg/act Aers (Albuterol) .... Use orn 4)  Omeprazole 20 Mg Cpdr (Omeprazole) .... Take 2 tabs daily 5)  Vitamin D3 1000 Unit Tabs (Cholecalciferol) .Marland Kitchen.. 1 by mouth daily 6)  Fioricet 50-325-40 Mg Tabs (Butalbital-apap-caffeine) .Marland Kitchen.. 1 by mouth qid prn 7)  Calcium 600/vitamin D 600-400 Mg-unit Chew (Calcium carbonate-vitamin d) .... Once daily 8)  Claritin 10 Mg Caps (Loratadine) .... Once daily 9)  Lipitor 10 Mg Tabs (Atorvastatin calcium) .Marland Kitchen.. 1 by mouth daily 10)  Benefiber Powd (Wheat dextrin) .... 2 tsp. once daily 11)  Azithromycin 250 Mg Tabs (Azithromycin) .... 2 tabs by mouth today, then 1 by mouth daily starting tomorrow 12)  Mucinex D 60-600 Mg Xr12h-tab (Pseudoephedrine-guaifenesin) .... Every 12 h x 5 days, then as needed for congestion and drainage  Patient Instructions: 1)  it was good to see you today. 2)  Zpack  + Mucinex D - your antibiotic prescription has been electronically submitted to your pharmacy. Please take as directed. Contact our office if you believe you're having problems with the medication(s).  3)  resume Nasonex and continue irrigation as ongoing 4)  Get plenty of rest, drink lots of clear liquids, and use Tylenol or Ibuprofen for fever and comfort. Return in 7-10 days if you're not better:sooner if you're feeling worse. Prescriptions: AZITHROMYCIN 250 MG TABS (AZITHROMYCIN) 2 tabs by mouth today, then 1 by mouth daily starting tomorrow  #6 x 0   Entered and Authorized by:   Newt Lukes MD   Signed by:   Newt Lukes MD on 07/22/2010   Method used:   Electronically to        [facility]* (retail)       [address] [place], [place]  [address]       Ph: [phone] or [phone]       Fax: [phone]   RxID:    [id number]    Orders Added: 1)  Est. Patient Level IV [27253]

## 2010-10-08 NOTE — Miscellaneous (Signed)
Summary: Orders Update  Clinical Lists Changes 

## 2010-10-08 NOTE — Assessment & Plan Note (Signed)
Summary: FLU VAC--AVP--STC   Nurse Visit   Allergies: 1)  ! * Topical Aristocort 2)  Fosamax 3)  Augmentin  Orders Added: 1)  Admin 1st Vaccine [90471] 2)  Flu Vaccine 31yrs + [45409] Flu Vaccine Consent Questions     Do you have a history of severe allergic reactions to this vaccine? no    Any prior history of allergic reactions to egg and/or gelatin? no    Do you have a sensitivity to the preservative Thimersol? no    Do you have a past history of Guillan-Barre Syndrome? no    Do you currently have an acute febrile illness? no    Have you ever had a severe reaction to latex? no    Vaccine information given and explained to patient? yes    Are you currently pregnant? no    Lot Number:[id number]   Exp Date:03/08/2011   Site Given  Right Deltoid IMu Vaccine 36yrs + [id number] .lbflu

## 2010-10-08 NOTE — Miscellaneous (Signed)
Summary: BONE DENSITY  Clinical Lists Changes  Orders: Added new Test order of T-Bone Densitometry (77080) - Signed Added new Test order of T-Lumbar Vertebral Assessment (77082) - Signed 

## 2010-10-08 NOTE — Progress Notes (Signed)
Summary: lipitor  Phone Note From Pharmacy   Caller: [facility]* Reason for Call: Needs renewal Summary of Call: Requesting renewal on Lipitor 10mg  #34 take 1 by mouth once daily. Last filled 03/03/2008. Initial call taken by: Orlan Leavens,  April 19, 2008 1:54 PM      Prescriptions: LIPITOR 20 MG  TABS (ATORVASTATIN CALCIUM) take 1 by mouth qd  #34 x 10   Entered by:   Orlan Leavens   Authorized by:   Tresa Garter MD   Signed by:   Orlan Leavens on 04/19/2008   Method used:   Electronically sent to ...       [facility]*       [address] [place], [place]  [address]       Ph: [phone] or [phone]       Fax: [phone]   RxID:   [id number]

## 2010-10-08 NOTE — Assessment & Plan Note (Signed)
Summary: flu shot/avp/pn   Nurse Visit    Prior Medications: LIPITOR 20 MG  TABS (ATORVASTATIN CALCIUM) take 1 by mouth qd NASONEX 50 MCG/ACT  SUSP (MOMETASONE FUROATE) use as directed ADVAIR DISKUS 100-50 MCG/DOSE  MISC (FLUTICASONE-SALMETEROL) use as directed ALBUTEROL 90 MCG/ACT  AERS (ALBUTEROL) use orn OMEPRAZOLE 20 MG CPDR (OMEPRAZOLE) Take 2 tabs daily VITAMIN D3 1000 UNIT  TABS (CHOLECALCIFEROL) 1 by mouth daily FIORICET 50-325-40 MG  TABS (BUTALBITAL-APAP-CAFFEINE) 1 by mouth qid prn XYZAL 5 MG TABS (LEVOCETIRIZINE DIHYDROCHLORIDE) once daily TOPICORT 0.25 % CREA (DESOXIMETASONE) apply two times a day as needed rash/itching Current Allergies: ! * TOPICAL ARISTOCORT FOSAMAX AUGMENTIN    Orders Added: 1)  Flu Vaccine 11yrs + [[address]] 2)  Administration Flu vaccine [G0008]   Flu Vaccine Consent Questions     Do you have a history of severe allergic reactions to this vaccine? no    Any prior history of allergic reactions to egg and/or gelatin? no    Do you have a sensitivity to the preservative Thimersol? no    Do you have a past history of Guillan-Barre Syndrome? no    Do you currently have an acute febrile illness? no    Have you ever had a severe reaction to latex? no    Vaccine information given and explained to patient? yes    Are you currently pregnant? no    Lot Number:[id number]   Site Given  right Deltoid Casiel.Bilberry ]

## 2010-10-08 NOTE — Progress Notes (Signed)
Summary: Actonel Rx & PA  Phone Note Call from Patient   Summary of Call: Pt needs PA for Actonel 35mg  1 q wk. 1 [phone]. Per Dr Posey Rea, pt has tried Fosamax, causes abd pain  PA approved  over phone, Pt informed Initial call taken by: Lamar Sprinkles,  September 01, 2007 1:08 PM      Prescriptions: ACTONEL 35 MG  TABS (RISEDRONATE SODIUM) take 1 by mouth q week  #4 x 12   Entered by:   Lamar Sprinkles   Authorized by:   Tresa Garter MD   Signed by:   Lamar Sprinkles on 09/01/2007   Method used:   Handwritten   RxID:   [id number]

## 2010-10-08 NOTE — Miscellaneous (Signed)
Summary: ACBE order  Clinical Lists Changes  Problems: Added new problem of SPECIAL SCREENING FOR MALIGNANT NEOPLASMS COLON (ICD-V76.51) - Incomplete colooscopy Orders: Added new Test order of Air Contrast  BE (Air Contrast BE) - Signed

## 2010-10-10 NOTE — Letter (Signed)
Summary: [facility]  [facility]   Imported By: Sherian Rein 09/06/2010 08:57:45  _____________________________________________________________________  External Attachment:    Type:   Image     Comment:   External Document

## 2010-10-28 ENCOUNTER — Other Ambulatory Visit: Payer: BC Managed Care – PPO

## 2010-10-28 ENCOUNTER — Other Ambulatory Visit: Payer: Self-pay | Admitting: Internal Medicine

## 2010-10-28 ENCOUNTER — Ambulatory Visit (INDEPENDENT_AMBULATORY_CARE_PROVIDER_SITE_OTHER): Payer: BC Managed Care – PPO | Admitting: Internal Medicine

## 2010-10-28 ENCOUNTER — Encounter: Payer: Self-pay | Admitting: Internal Medicine

## 2010-10-28 DIAGNOSIS — K5909 Other constipation: Secondary | ICD-10-CM | POA: Insufficient documentation

## 2010-10-28 DIAGNOSIS — R110 Nausea: Secondary | ICD-10-CM

## 2010-10-28 DIAGNOSIS — R1013 Epigastric pain: Secondary | ICD-10-CM

## 2010-10-28 LAB — URINALYSIS, ROUTINE W REFLEX MICROSCOPIC
Leukocytes, UA: NEGATIVE
Nitrite: NEGATIVE
Total Protein, Urine: NEGATIVE

## 2010-10-28 LAB — BASIC METABOLIC PANEL
BUN: 12 mg/dL (ref 6–23)
CO2: 29 mEq/L (ref 19–32)
Calcium: 9.5 mg/dL (ref 8.4–10.5)
Creatinine, Ser: 0.7 mg/dL (ref 0.4–1.2)
Glucose, Bld: 89 mg/dL (ref 70–99)

## 2010-10-28 LAB — CBC WITH DIFFERENTIAL/PLATELET
Basophils Absolute: 0 10*3/uL (ref 0.0–0.1)
Basophils Relative: 0.5 % (ref 0.0–3.0)
Hemoglobin: 14.3 g/dL (ref 12.0–15.0)
Lymphocytes Relative: 23.1 % (ref 12.0–46.0)
Monocytes Relative: 3.9 % (ref 3.0–12.0)
Neutro Abs: 4.8 10*3/uL (ref 1.4–7.7)
RBC: 4.78 Mil/uL (ref 3.87–5.11)

## 2010-10-28 LAB — HEPATIC FUNCTION PANEL
Albumin: 4.1 g/dL (ref 3.5–5.2)
Total Protein: 6.9 g/dL (ref 6.0–8.3)

## 2010-10-28 LAB — SEDIMENTATION RATE: Sed Rate: 16 mm/hr (ref 0–22)

## 2010-10-30 ENCOUNTER — Other Ambulatory Visit: Payer: Self-pay | Admitting: Internal Medicine

## 2010-10-30 DIAGNOSIS — R110 Nausea: Secondary | ICD-10-CM

## 2010-10-30 DIAGNOSIS — R1013 Epigastric pain: Secondary | ICD-10-CM

## 2010-11-01 ENCOUNTER — Ambulatory Visit
Admission: RE | Admit: 2010-11-01 | Discharge: 2010-11-01 | Disposition: A | Discharge status: Home / Self Care | Payer: BC Managed Care – PPO | Source: Ambulatory Visit | Attending: Internal Medicine | Admitting: Internal Medicine

## 2010-11-01 DIAGNOSIS — R1013 Epigastric pain: Secondary | ICD-10-CM

## 2010-11-01 DIAGNOSIS — R110 Nausea: Secondary | ICD-10-CM

## 2010-11-04 ENCOUNTER — Telehealth: Payer: Self-pay | Admitting: Internal Medicine

## 2010-11-05 NOTE — Assessment & Plan Note (Signed)
Summary: BURPING AND PAIN IN ABD /NWS   Vital Signs:  Patient profile:   60 year old female Height:      66 inches Weight:      145 pounds BMI:     23.49 Temp:     97.4 degrees F oral Pulse rate:   80 / minute Pulse rhythm:   regular Resp:     16 per minute BP sitting:   128 / 72  (left arm) Cuff size:   regular  Vitals Entered By: Lanier Prude, CMA(AAMA) (October 28, 2010 9:44 AM) CC: Epigastric abd pain and nausea alone since the weekend Is Patient Diabetic? No Comments pt is not currently taking Mucinex D.   Primary Care Provider:  Georgina Quint Donzell Coller MD  CC:  Epigastric abd pain and nausea alone since the weekend.  History of Present Illness: C/o abd epig pain and burping x since Sat. The pain was bad. Last BM yesturday. No BM prior to Sat x 5 days. She woke up with epig pain this am. She had some lower abd pains as well.  Current Medications (verified): 1)  Nasonex 50 Mcg/act  Susp (Mometasone Furoate) .... Use As Directed 2)  Advair Diskus 100-50 Mcg/dose  Misc (Fluticasone-Salmeterol) .... Use As Directed 3)  Albuterol 90 Mcg/act  Aers (Albuterol) .... Use Orn 4)  Omeprazole 20 Mg Cpdr (Omeprazole) .... Take 2 Tabs Daily 5)  Vitamin D3 1000 Unit  Tabs (Cholecalciferol) .Marland Kitchen.. 1 By Mouth Daily 6)  Fioricet 50-325-40 Mg  Tabs (Butalbital-Apap-Caffeine) .Marland Kitchen.. 1 By Mouth Qid Prn 7)  Calcium 600/vitamin D 600-400 Mg-Unit Chew (Calcium Carbonate-Vitamin D) .... Once Daily 8)  Claritin 10 Mg Caps (Loratadine) .... Once Daily 9)  Lipitor 10 Mg Tabs (Atorvastatin Calcium) .Marland Kitchen.. 1 By Mouth Daily 10)  Benefiber  Powd (Wheat Dextrin) .... 2 Tsp. Once Daily 11)  Mucinex D 60-600 Mg Xr12h-Tab (Pseudoephedrine-Guaifenesin) .... Every 12 H X 5 Days, Then As Needed For Congestion and Drainage  Allergies (verified): 1)  ! * Topical Aristocort 2)  Fosamax 3)  Augmentin  Past History:  Past Medical History: Last updated: 07/22/2010 Asthma Hyperlipidemia Osteopenia Very mild  carotid stenosis GERD Dr Marina Goodell  Allergic rhinitis Anemia-NOS migraine GYN DR Greta Doom  Past Surgical History: Last updated: 10/04/2007 Tubal ligation  Social History: Last updated: 10/04/2007 Retired Married 2 children Never Smoked  Review of Systems       The patient complains of abdominal pain.  The patient denies fever, weight loss, weight gain, melena, hematochezia, severe indigestion/heartburn, and hematuria.    Physical Exam  General:  alert, well-developed, well-nourished, and cooperative to examination.    Nose:  External nasal examination shows no deformity or inflammation. Nasal mucosa are pink and moist without lesions or exudates. Mouth:  teeth and gums in good repair; mucous membranes moist, without lesions or ulcers. oropharynx clear without exudate Neck:  No deformities, masses, or tenderness noted. Lungs:  normal respiratory effort, no intercostal retractions or use of accessory muscles; normal breath sounds bilaterally - no crackles and no wheezes.    Heart:  normal rate, regular rhythm, no murmur, and no rub. BLE without edema. Abdomen:  soft, non-tender, normal bowel sounds, no distention, no masses, no guarding, no rigidity, no rebound tenderness, no abdominal hernia, no inguinal hernia, no hepatomegaly, and no splenomegaly.  Epig is a little sensitive Msk:  No deformity or scoliosis noted of thoracic or lumbar spine.   Neurologic:  No cranial nerve deficits noted. Station and gait are  normal. Plantar reflexes are down-going bilaterally. DTRs are symmetrical throughout. Sensory, motor and coordinative functions appear intact.   Impression & Recommendations:  Problem # 1:  ABDOMINAL PAIN, EPIGASTRIC (ICD-789.06) ? etiology Assessment New Diff dx is broad Orders: Radiology Referral (Radiology) abd [place] TLB-BMP (Basic Metabolic Panel-BMET) (80048-METABOL) TLB-Hepatic/Liver Function Pnl (80076-HEPATIC) TLB-CBC Platelet - w/Differential (85025-CBCD) TLB-Udip  ONLY (81003-UDIP) TLB-Lipase (83690-LIPASE) TLB-Sedimentation Rate (ESR) (85652-ESR)  Problem # 2:  NAUSEA (ICD-787.02) Assessment: New  Orders: Radiology Referral (Radiology)  Problem # 3:  CONSTIPATION, CHRONIC (ICD-564.09) Assessment: Unchanged  Her updated medication list for this problem includes:    Benefiber Powd (Wheat dextrin) .Marland Kitchen... 2 tsp. once daily    Senokot S 8.6-50 Mg Tabs (Sennosides-docusate sodium) .Marland Kitchen... 1 by mouth bid  Complete Medication List: 1)  Nasonex 50 Mcg/act Susp (Mometasone furoate) .... Use as directed 2)  Advair Diskus 100-50 Mcg/dose Misc (Fluticasone-salmeterol) .... Use as directed 3)  Albuterol 90 Mcg/act Aers (Albuterol) .... Use orn 4)  Omeprazole 20 Mg Cpdr (Omeprazole) .... Take 2 tabs daily 5)  Vitamin D3 1000 Unit Tabs (Cholecalciferol) .Marland Kitchen.. 1 by mouth daily 6)  Fioricet 50-325-40 Mg Tabs (Butalbital-apap-caffeine) .Marland Kitchen.. 1 by mouth qid prn 7)  Calcium 600/vitamin D 600-400 Mg-unit Chew (Calcium carbonate-vitamin d) .... Once daily 8)  Claritin 10 Mg Caps (Loratadine) .... Once daily 9)  Lipitor 10 Mg Tabs (Atorvastatin calcium) .Marland Kitchen.. 1 by mouth daily 10)  Benefiber Powd (Wheat dextrin) .... 2 tsp. once daily 11)  Mucinex D 60-600 Mg Xr12h-tab (Pseudoephedrine-guaifenesin) .... Every 12 h x 5 days, then as needed for congestion and drainage 12)  Promethazine Hcl 25 Mg Tabs (Promethazine hcl) .Marland Kitchen.. 1-2 by mouth four times a day as needed nausea 13)  Senokot S 8.6-50 Mg Tabs (Sennosides-docusate sodium) .Marland Kitchen.. 1 by mouth bid  Patient Instructions: 1)  Try TUMs or Mylanta 2)  Please schedule a follow-up appointment in 2 weeks. 3)  Call if you are not better in a reasonable amount of time or if worse. Go to ER if feeling really bad!  Prescriptions: PROMETHAZINE HCL 25 MG TABS (PROMETHAZINE HCL) 1-2 by mouth four times a day as needed nausea  #60 x 1   Entered and Authorized by:   Tresa Garter MD   Signed by:   Tresa Garter MD on  10/28/2010   Method used:   Electronically to        [facility]* (retail)       [address] [place], [place]  [address]       Ph: [phone] or [phone]       Fax: [phone]   RxID:   [id number]    Orders Added: 1)  Est. Patient Level IV [27253] 2)  Radiology Referral [Radiology] 3)  TLB-BMP (Basic Metabolic Panel-BMET) [80048-METABOL] 4)  TLB-Hepatic/Liver Function Pnl [80076-HEPATIC] 5)  TLB-CBC Platelet - w/Differential [85025-CBCD] 6)  TLB-Udip ONLY [81003-UDIP] 7)  TLB-Lipase [83690-LIPASE] 8)  TLB-Sedimentation Rate (ESR) [85652-ESR]

## 2010-11-14 NOTE — Progress Notes (Signed)
Summary: FYI  UPDATE  Phone Note Call from Patient   Summary of Call: FYI - Pt informed of u/s results. She is feeling much better Initial call taken by: Lamar Sprinkles, CMA,  November 04, 2010 6:55 PM  Follow-up for Phone Call        noted Follow-up by: Tresa Garter MD,  November 04, 2010 9:33 PM

## 2010-11-20 ENCOUNTER — Encounter: Payer: Self-pay | Admitting: Internal Medicine

## 2010-11-20 ENCOUNTER — Ambulatory Visit (INDEPENDENT_AMBULATORY_CARE_PROVIDER_SITE_OTHER): Payer: BC Managed Care – PPO | Admitting: Internal Medicine

## 2010-11-20 DIAGNOSIS — K219 Gastro-esophageal reflux disease without esophagitis: Secondary | ICD-10-CM

## 2010-11-20 DIAGNOSIS — R1013 Epigastric pain: Secondary | ICD-10-CM

## 2010-11-20 DIAGNOSIS — E785 Hyperlipidemia, unspecified: Secondary | ICD-10-CM

## 2010-11-20 DIAGNOSIS — K5909 Other constipation: Secondary | ICD-10-CM

## 2010-11-26 NOTE — Assessment & Plan Note (Signed)
Summary: 2 WK FU [name] [name] #   Vital Signs:  Patient profile:   60 year old female Height:      66 inches Weight:      146 pounds BMI:     23.65 Temp:     98.1 degrees F oral Pulse rate:   76 / minute Pulse rhythm:   regular Resp:     16 per minute BP sitting:   130 / 78  (left arm) Cuff size:   regular  Vitals Entered By: Lanier Prude, CMA(AAMA) (November 20, 2010 3:50 PM) CC: 2 wk f/u Is Patient Diabetic? No Comments pt is not taking Mucinex or promethazine   Primary Care Provider:  Tresa Garter MD  CC:  2 wk f/u.  History of Present Illness: F/u abd pain - resolved C/o constipation F/u GERD  Current Medications (verified): 1)  Nasonex 50 Mcg/act  Susp (Mometasone Furoate) .... Use As Directed 2)  Advair Diskus 100-50 Mcg/dose  Misc (Fluticasone-Salmeterol) .... Use As Directed 3)  Albuterol 90 Mcg/act  Aers (Albuterol) .... Use Orn 4)  Omeprazole 20 Mg Cpdr (Omeprazole) .... Take 2 Tabs Daily 5)  Vitamin D3 1000 Unit  Tabs (Cholecalciferol) .Marland Kitchen.. 1 By Mouth Daily 6)  Fioricet 50-325-40 Mg  Tabs (Butalbital-Apap-Caffeine) .Marland Kitchen.. 1 By Mouth Qid Prn 7)  Calcium 600/vitamin D 600-400 Mg-Unit Chew (Calcium Carbonate-Vitamin D) .... Once Daily 8)  Claritin 10 Mg Caps (Loratadine) .... Once Daily 9)  Lipitor 10 Mg Tabs (Atorvastatin Calcium) .Marland Kitchen.. 1 By Mouth Daily 10)  Benefiber  Powd (Wheat Dextrin) .... 2 Tsp. Once Daily 11)  Mucinex D 60-600 Mg Xr12h-Tab (Pseudoephedrine-Guaifenesin) .... Every 12 H X 5 Days, Then As Needed For Congestion and Drainage 12)  Promethazine Hcl 25 Mg Tabs (Promethazine Hcl) .Marland Kitchen.. 1-2 By Mouth Four Times A Day As Needed Nausea 13)  Senokot S 8.6-50 Mg Tabs (Sennosides-Docusate Sodium) .Marland Kitchen.. 1 By Mouth Bid  Allergies (verified): 1)  ! * Topical Aristocort 2)  Fosamax 3)  Augmentin  Past History:  Past Surgical History: Last updated: 10/04/2007 Tubal ligation  Social History: Last updated: 10/04/2007 Retired Married 2 children Never  Smoked  Past Medical History: Asthma Hyperlipidemia Osteopenia Very mild carotid stenosis GERD Dr Marina Goodell  Allergic rhinitis Anemia-NOS migraine GYN DR Greta Doom  Physical Exam  General:  alert, well-developed, well-nourished, and cooperative to examination.    Nose:  External nasal examination shows no deformity or inflammation. Nasal mucosa are pink and moist without lesions or exudates. Mouth:  teeth and gums in good repair; mucous membranes moist, without lesions or ulcers. oropharynx clear without exudate Neck:  No deformities, masses, or tenderness noted. Lungs:  normal respiratory effort, no intercostal retractions or use of accessory muscles; normal breath sounds bilaterally - no crackles and no wheezes.    Heart:  normal rate, regular rhythm, no murmur, and no rub. BLE without edema. Abdomen:  soft, non-tender, normal bowel sounds, no distention, no masses, no guarding, no rigidity, no rebound tenderness, no abdominal hernia, no inguinal hernia, no hepatomegaly, and no splenomegaly.  Epig is a little sensitive Msk:  No deformity or scoliosis noted of thoracic or lumbar spine.   Neurologic:  No cranial nerve deficits noted. Station and gait are normal. Plantar reflexes are down-going bilaterally. DTRs are symmetrical throughout. Sensory, motor and coordinative functions appear intact. Skin:  Intact without suspicious lesions or rashes Psych:  Cognition and judgment appear intact. Alert and cooperative with normal attention span and concentration. No apparent delusions, illusions, hallucinations  Impression & Recommendations:  Problem # 1:  CONSTIPATION, CHRONIC (ICD-564.09) Assessment Unchanged Hold Lipitor x 1 month GI consult was offered with Dr Marina Goodell Her updated medication list for this problem includes:    Benefiber Powd (Wheat dextrin) .Marland Kitchen... 2 tsp. once daily    Senokot S 8.6-50 Mg Tabs (Sennosides-docusate sodium) .Marland Kitchen... 1 by mouth bid  Problem # 2:  HYPERLIPIDEMIA  (ICD-272.4) Assessment: Comment Only  Her updated medication list for this problem includes:    Lipitor 10 Mg Tabs (Atorvastatin calcium) .Marland Kitchen... 1 by mouth daily  Problem # 3:  ABDOMINAL PAIN, EPIGASTRIC (ICD-789.06) resolved Assessment: Improved ? viral Labs and [place] discussed  Problem # 4:  GERD (ICD-530.81) Assessment: Improved  The following medications were removed from the medication list:    Omeprazole 20 Mg Cpdr (Omeprazole) .Marland Kitchen... Take 2 tabs daily she wants to switch Her updated medication list for this problem includes:    Ranitidine Hcl 150 Mg Tabs (Ranitidine hcl) .Marland Kitchen... 1 by mouth two times a day for indigestion  Complete Medication List: 1)  Nasonex 50 Mcg/act Susp (Mometasone furoate) .... Use as directed 2)  Advair Diskus 100-50 Mcg/dose Misc (Fluticasone-salmeterol) .... Use as directed 3)  Albuterol 90 Mcg/act Aers (Albuterol) .... Use orn 4)  Vitamin D3 1000 Unit Tabs (Cholecalciferol) .Marland Kitchen.. 1 by mouth daily 5)  Fioricet 50-325-40 Mg Tabs (Butalbital-apap-caffeine) .Marland Kitchen.. 1 by mouth qid prn 6)  Calcium 600/vitamin D 600-400 Mg-unit Chew (Calcium carbonate-vitamin d) .... Once daily 7)  Claritin 10 Mg Caps (Loratadine) .... Once daily 8)  Lipitor 10 Mg Tabs (Atorvastatin calcium) .Marland Kitchen.. 1 by mouth daily 9)  Benefiber Powd (Wheat dextrin) .... 2 tsp. once daily 10)  Mucinex D 60-600 Mg Xr12h-tab (Pseudoephedrine-guaifenesin) .... Every 12 h x 5 days, then as needed for congestion and drainage 11)  Promethazine Hcl 25 Mg Tabs (Promethazine hcl) .Marland Kitchen.. 1-2 by mouth four times a day as needed nausea 12)  Senokot S 8.6-50 Mg Tabs (Sennosides-docusate sodium) .Marland Kitchen.. 1 by mouth bid 13)  Ranitidine Hcl 150 Mg Tabs (Ranitidine hcl) .Marland Kitchen.. 1 by mouth two times a day for indigestion  Patient Instructions: 1)  Hold Lipitor x 1 month 2)  Keep return office visit  Prescriptions: RANITIDINE HCL 150 MG TABS (RANITIDINE HCL) 1 by mouth two times a day for indigestion  #60 x 12   Entered and  Authorized by:   Tresa Garter MD   Signed by:   Tresa Garter MD on 11/20/2010   Method used:   Print then Give to Patient   RxID:   [id number]    Orders Added: 1)  Est. Patient Level IV [14782]

## 2011-01-21 NOTE — Assessment & Plan Note (Signed)
[facility]                         GASTROENTEROLOGY OFFICE NOTE   NAME:[name] [name], [name] [name]                   MRN:          [id number]  DATE:07/09/2007                            DOB:          Jun 12, 1951    HISTORY:  [name] [name] presents today for routine follow up. She is a 60 year old  with gastroesophageal reflux disease complicated by esophagitis and  peptic stricture. She is currently maintained on Aciphex 20 mg daily. On  the medication, which she tolerates well, she does well. She has had no  significant heartburn, indigestion, or dysphagia. There are no  appreciable medication side effects. She does inquire regarding the  possible effects of proton pump inhibitors on calcium. We discussed this  in detail. Next, she is interested in proceeding with her screening  colonoscopy. Her last exam over six years ago was negative. She is  having no problems with abdominal pain, change in bowel habits, or  rectal bleeding.   CURRENT MEDICATIONS:  1. Loratadine 10 mg daily.  2. Lipitor 20 mg daily.  3. Actonel 35 mg daily.  4. Aciphex 20 mg daily.  5. Nasonex.  6. Advair.  7. Albuterol as needed.   PHYSICAL EXAMINATION:  GENERAL:  Well appearing female in no acute  distress. She is alert and oriented.  VITAL SIGNS:  Blood pressure 120/70, heart rate 80, weight 154 pounds.  HEENT:  Sclerae anicteric, conjunctivae are pink, oral mucosa intact.  LUNGS:  Clear.  HEART:  Regular.  ABDOMEN:  Soft without tenderness, mass, or hernia.   IMPRESSION:  1. Gastroesophageal reflux disease with a history of esophagitis and      stricture. Currently asymptomatic on Aciphex.  2. Questions regarding Aciphex and its effect on calcium discussed.      The patient has her bone density followed closely by Dr. Posey Rea      and has been treated accordingly.  3. Screening colonoscopy. The nature of the procedure, as well as the      risks, benefits, and alternatives have  been reviewed. She      understood and agreed to proceed.     Wilhemina Bonito. Marina Goodell, MD  Electronically Signed   JNP/MedQ  DD: 07/09/2007  DT: 07/11/2007  Job #: [id number]   cc:   Georgina Quint. Plotnikov, MD

## 2011-01-21 NOTE — Assessment & Plan Note (Signed)
[facility]                           PRIMARY CARE OFFICE NOTE   NAME:[name], [name] AYLER                   MRN:          [id number]  DATE:03/04/2007                            DOB:          [birth year]    The patient is a 60 year old female who presents for a wellness  examination.   Past medical history, family history, social history as per March 02, 2003, note.   CURRENT MEDICATIONS:  Reviewed.   ALLERGIES:  Topical ARISTOCORT.   REVIEW OF SYSTEMS:  No chest pain or shortness of breath, no syncope, no  neurologic complaints.  Denies being depressed.  The rest of the 18-  point review of systems is negative.   PHYSICAL EXAMINATION:  GENERAL APPEARANCE:  Looks well.  VITAL SIGNS:  Blood pressure 132/78, pulse 76, temperature 98.5, weight  161 pounds.  HEENT:  Moist mucosa.  NECK:  Supple.  No thyromegaly or bruit.  LUNGS:  Clear to auscultation and percussion.  No wheezes or rales.  CARDIOVASCULAR:  S1 and S2, no murmur, no gallop.  ABDOMEN:  Soft, nontender, no organomegaly, no masses felt.  EXTREMITIES:  Lower extremities without edema.  NEUROLOGIC:  She is alert, oriented and cooperative.  Denies being  depressed.   LABORATORY DATA:  February 24, 2007, CBC normal.  CMET normal.  Cholesterol  189, LDL 116, HDL 65.8.  Urinalysis normal.  TSH normal.   ASSESSMENT/PLAN:  1. Normal wellness examination.  Age/health issues discussed.      Lifestyle discussed.  Regular gynecologic care with Dr. Greta Doom.  A      bone density scan was obtained.  Mammogram yearly.  2. Osteopenia.  She is on Actonel 35 mg weekly.  Calcium and vitamin D      to continue.  3. Dyslipidemia.  She is on Lipitor 10 mg daily.  4. Carotid artery stenosis, mild.  Will check carotid Doppler      ultrasound.  We will decide if need to go up on Lipitor dose or      not.  She is completely asymptomatic.  5. I will see her back in six months.  Will repeat the lipid  profile.     Georgina Quint. Plotnikov, MD  Electronically Signed    AVP/MedQ  DD: 03/04/2007  DT: 03/04/2007  Job #: [id number]

## 2011-01-21 NOTE — Assessment & Plan Note (Signed)
[facility]                         GASTROENTEROLOGY OFFICE NOTE   NAME:[name] [name], [name] [name]                   MRN:          [id number]  DATE:10/07/2007                            DOB:          December 26, 1950    HISTORY:  [name] [name] schedules today's appointment to discuss her recent  colonoscopy.  She is a 60 year old with gastroesophageal reflux disease  complicated by esophagitis and peptic stricture.  She had been  maintained on Aciphex 20 mg daily and was doing well.  However, due to  insurance formulary preference or change, it was recommended that she  obtain generic omeprazole.  She has been on this and states she  continues to do well.  She recently underwent screening colonoscopy.  Unfortunately, despite comply with the MiraLax prep instruction, her  preparation was poor, with solid stool throughout.  I recommended  routine repeat examination in one year, given the inadequate nature of  the exam.  She has been asymptomatic.  She had also had a negative  colonoscopy about 7 years ago.  We reviewed the exam in detail.  She  continues to deny any lower GI symptoms.   Her medications are unchanged except omeprazole in place of Aciphex.   PHYSICAL EXAMINATION:  Physical examination today was limited to her  vital signs at 124/68, heart rate was 84, and weight is 150.2 pounds.   IMPRESSION:  1. Gastroesophageal reflux disease with a history of esophagitis and      stricture.  The patient is asymptomatic after changing from Aciphex      to omeprazole.  2. Recent screening colonoscopy with poor preparation.  Repeat      examination with split prep (i.e. MoviPrep) in about one year.      Obviously, sooner should the patient develop any signs or symptoms      that would indicate such.  She shall resume her care with Dr.      Posey Rea.     Wilhemina Bonito. Marina Goodell, MD  Electronically Signed    JNP/MedQ  DD: 10/07/2007  DT: 10/07/2007  Job #: [id number]   cc:    Georgina Quint. Plotnikov, MD

## 2011-01-24 NOTE — Assessment & Plan Note (Signed)
[name]                           GASTROENTEROLOGY OFFICE NOTE   NAME:[name], [name] THROGMORTON                   MRN:          [id number]  DATE:06/18/2006                            DOB:          06-Feb-1951    [name] [name] presents today for annual followup regarding management of her reflux  disease.  She is a 60 year old with a history of reflux disease for which  she has undergone prior endoscopy in January, 2003.  She was found to have  esophagitis as well as an asymptomatic large caliber esophageal stricture.  Since the time of her last office visit one year ago, she has been  maintained on AcipHex 20 mg daily.  On the medication, she is doing well.  Rarely breakthrough heartburn when she eats bananas.  No dysphagia.  No  abdominal pain.  Her bowel habits are regular without bleeding.  Her last  colonoscopy was in 2002.  A followup in five years is recommended.  However,  the patient has no family history.  Her index exam was normal with excellent  preparation.  She is currently caring for her husband, who underwent surgery  for prostate cancer.  As well, two of her grandchildren are having some  medical problems evaluated.   CURRENT MEDICATIONS:  1. Loratadine 10 mg daily.  2. Lipitor 10 mg daily.  3. Actonel 35 mg weekly.  4. AcipHex 20 mg daily.  5. She also uses Nasonex p.r.n., Advair p.r.n., and albuterol p.r.n.   PHYSICAL EXAMINATION:  GENERAL:  A well-appearing female in no acute  distress.  VITAL SIGNS:  Blood pressure 132/74, heart rate 60 and regular, weight is  145.8 pounds.  HEENT:  Sclerae are anicteric.  LUNGS:  Clear.  HEART:  Regular.  ABDOMEN:  Soft without tenderness, mass, or hernia.  No organomegaly.  Good  bowel sounds heard.   IMPRESSION:  1. Gastroesophageal reflux disease:  Symptoms controlled on once-daily      AcipHex.  2. Screening colonoscopy in 2002.   RECOMMENDATIONS:  1. Continue AcipHex 20 mg daily.  A  prescription with multiple refills has      been provided.  2. Push-out colonoscopy recall in several years, more in keeping with      current guidelines.  3. Office followup in one year unless interval questions or problems.  She      will continue her general medical care with Dr. Posey Rea.            ______________________________  Wilhemina Bonito. Eda Keys., MD      JNP/MedQ  DD:  06/18/2006  DT:  06/19/2006  Job #:  [id number]

## 2011-02-20 ENCOUNTER — Other Ambulatory Visit (INDEPENDENT_AMBULATORY_CARE_PROVIDER_SITE_OTHER): Payer: BC Managed Care – PPO

## 2011-02-20 ENCOUNTER — Other Ambulatory Visit: Payer: Self-pay | Admitting: Internal Medicine

## 2011-02-20 DIAGNOSIS — Z23 Encounter for immunization: Secondary | ICD-10-CM

## 2011-02-20 DIAGNOSIS — Z0000 Encounter for general adult medical examination without abnormal findings: Secondary | ICD-10-CM

## 2011-02-20 LAB — URINALYSIS
Leukocytes, UA: NEGATIVE
Nitrite: NEGATIVE
Specific Gravity, Urine: 1.015 (ref 1.000–1.030)
Urobilinogen, UA: 0.2 (ref 0.0–1.0)
pH: 7 (ref 5.0–8.0)

## 2011-02-20 LAB — CBC WITH DIFFERENTIAL/PLATELET
Basophils Relative: 0.8 % (ref 0.0–3.0)
Eosinophils Absolute: 0.1 10*3/uL (ref 0.0–0.7)
Eosinophils Relative: 1.1 % (ref 0.0–5.0)
Hemoglobin: 14.3 g/dL (ref 12.0–15.0)
MCHC: 34.2 g/dL (ref 30.0–36.0)
MCV: 87.9 fl (ref 78.0–100.0)
Monocytes Absolute: 0.3 10*3/uL (ref 0.1–1.0)
Neutro Abs: 3.4 10*3/uL (ref 1.4–7.7)
Neutrophils Relative %: 60.3 % (ref 43.0–77.0)
RBC: 4.76 Mil/uL (ref 3.87–5.11)
WBC: 5.6 10*3/uL (ref 4.5–10.5)

## 2011-02-20 LAB — HEPATIC FUNCTION PANEL
Alkaline Phosphatase: 43 U/L (ref 39–117)
Bilirubin, Direct: 0.1 mg/dL (ref 0.0–0.3)
Total Bilirubin: 0.7 mg/dL (ref 0.3–1.2)

## 2011-02-20 LAB — BASIC METABOLIC PANEL
BUN: 16 mg/dL (ref 6–23)
CO2: 29 mEq/L (ref 19–32)
Calcium: 9.4 mg/dL (ref 8.4–10.5)
Creatinine, Ser: 0.7 mg/dL (ref 0.4–1.2)
Glucose, Bld: 89 mg/dL (ref 70–99)

## 2011-02-20 LAB — LIPID PANEL
LDL Cholesterol: 116 mg/dL — ABNORMAL HIGH (ref 0–99)
Total CHOL/HDL Ratio: 3
Triglycerides: 53 mg/dL (ref 0.0–149.0)

## 2011-02-26 ENCOUNTER — Encounter: Payer: Self-pay | Admitting: Internal Medicine

## 2011-02-27 ENCOUNTER — Ambulatory Visit (INDEPENDENT_AMBULATORY_CARE_PROVIDER_SITE_OTHER): Payer: BC Managed Care – PPO | Admitting: Internal Medicine

## 2011-02-27 ENCOUNTER — Encounter: Payer: Self-pay | Admitting: Internal Medicine

## 2011-02-27 VITALS — BP 130/90 | HR 78 | Temp 97.8°F | Resp 16 | Ht 66.0 in | Wt 143.0 lb

## 2011-02-27 DIAGNOSIS — Z0000 Encounter for general adult medical examination without abnormal findings: Secondary | ICD-10-CM

## 2011-02-27 DIAGNOSIS — Z23 Encounter for immunization: Secondary | ICD-10-CM

## 2011-02-27 MED ORDER — BUTALBITAL-APAP-CAFF-COD 50-325-40-30 MG PO CAPS: 1.0000 | ORAL_CAPSULE | Freq: Four times a day (QID) | ORAL | Status: DC | PRN

## 2011-02-27 MED ORDER — ATORVASTATIN CALCIUM 10 MG PO TABS: 10.0000 mg | ORAL_TABLET | Freq: Every day | ORAL | Status: DC

## 2011-02-27 NOTE — Patient Instructions (Signed)
Nl BP <130/85

## 2011-02-27 NOTE — Assessment & Plan Note (Signed)
We discussed age appropriate health related issues, including available/recomended screening tests and vaccinations. We discussed a need for adhering to healthy diet and exercise. Labs/EKG were reviewed/ordered. All questions were answered.   

## 2011-02-27 NOTE — Progress Notes (Signed)
  Subjective:    Patient ID: [name] [name], female    DOB: Aug 15, 1951, 60 y.o.   MRN: [id number]  HPI  The patient is here for a wellness exam. The patient has been doing well overall without major physical or psychological issues going on lately. The patient needs to address  allergies that has been well controlled with medicines; to address chronic  hyperlipidemia controlled with medicines well; and to address GERD, controlled with medical treatment and diet.   Review of Systems  Constitutional: Negative for fever, chills, diaphoresis, activity change, appetite change, fatigue and unexpected weight change.  HENT: Negative for hearing loss, ear pain, congestion, sore throat, sneezing, mouth sores, neck pain, dental problem, voice change, postnasal drip and sinus pressure.   Eyes: Negative for pain and visual disturbance.  Respiratory: Negative for cough, chest tightness, wheezing and stridor.   Cardiovascular: Negative for chest pain, palpitations and leg swelling.  Gastrointestinal: Negative for nausea, vomiting, abdominal pain, blood in stool, abdominal distention and rectal pain.  Genitourinary: Negative for dysuria, hematuria, decreased urine volume, vaginal bleeding, vaginal discharge, difficulty urinating, vaginal pain and menstrual problem.  Musculoskeletal: Negative for back pain, joint swelling and gait problem.  Skin: Negative for color change, rash and wound.  Neurological: Positive for dizziness (occasional). Negative for tremors, syncope, speech difficulty and light-headedness.  Hematological: Negative for adenopathy.  Psychiatric/Behavioral: Negative for suicidal ideas, hallucinations, behavioral problems, confusion, sleep disturbance, dysphoric mood and decreased concentration. The patient is not hyperactive.        Objective:   Physical Exam  Constitutional: She appears well-developed and well-nourished. No distress.  HENT:  Head: Normocephalic.  Right Ear: External  ear normal.  Left Ear: External ear normal.  Nose: Nose normal.  Mouth/Throat: Oropharynx is clear and moist.  Eyes: Conjunctivae are normal. Pupils are equal, round, and reactive to light. Right eye exhibits no discharge. Left eye exhibits no discharge.  Neck: Normal range of motion. Neck supple. No JVD present. No tracheal deviation present. No thyromegaly present.  Cardiovascular: Normal rate, regular rhythm and normal heart sounds.   Pulmonary/Chest: No stridor. No respiratory distress. She has no wheezes.  Abdominal: Soft. Bowel sounds are normal. She exhibits no distension and no mass. There is no tenderness. There is no rebound and no guarding.  Musculoskeletal: She exhibits no edema and no tenderness.  Lymphadenopathy:    She has no cervical adenopathy.  Neurological: She displays normal reflexes. No cranial nerve deficit. She exhibits normal muscle tone. Coordination normal.  Skin: No rash noted. No erythema.  Psychiatric: She has a normal mood and affect. Her behavior is normal. Judgment and thought content normal.          Assessment & Plan:

## 2011-02-27 NOTE — Progress Notes (Signed)
Addended by: Merrilyn Puma on: 02/27/2011 05:06 PM   Modules accepted: Orders

## 2011-04-14 ENCOUNTER — Other Ambulatory Visit: Payer: Self-pay | Admitting: Gynecology

## 2011-04-14 DIAGNOSIS — Z1231 Encounter for screening mammogram for malignant neoplasm of breast: Secondary | ICD-10-CM

## 2011-05-27 ENCOUNTER — Ambulatory Visit: Payer: BC Managed Care – PPO

## 2011-05-30 ENCOUNTER — Ambulatory Visit
Admission: RE | Admit: 2011-05-30 | Discharge: 2011-05-30 | Disposition: A | Discharge status: Home / Self Care | Payer: BC Managed Care – PPO | Source: Ambulatory Visit | Attending: Gynecology | Admitting: Gynecology

## 2011-05-30 DIAGNOSIS — Z1231 Encounter for screening mammogram for malignant neoplasm of breast: Secondary | ICD-10-CM

## 2011-06-24 ENCOUNTER — Ambulatory Visit (INDEPENDENT_AMBULATORY_CARE_PROVIDER_SITE_OTHER): Payer: BC Managed Care – PPO | Admitting: *Deleted

## 2011-06-24 DIAGNOSIS — Z23 Encounter for immunization: Secondary | ICD-10-CM

## 2011-07-03 ENCOUNTER — Ambulatory Visit (INDEPENDENT_AMBULATORY_CARE_PROVIDER_SITE_OTHER): Payer: BC Managed Care – PPO | Admitting: Internal Medicine

## 2011-07-03 ENCOUNTER — Encounter: Payer: Self-pay | Admitting: Internal Medicine

## 2011-07-03 VITALS — BP 130/80 | HR 76 | Temp 98.0°F | Resp 16 | Wt 145.0 lb

## 2011-07-03 DIAGNOSIS — H6590 Unspecified nonsuppurative otitis media, unspecified ear: Secondary | ICD-10-CM

## 2011-07-03 DIAGNOSIS — IMO0001 Reserved for inherently not codable concepts without codable children: Secondary | ICD-10-CM

## 2011-07-03 DIAGNOSIS — R030 Elevated blood-pressure reading, without diagnosis of hypertension: Secondary | ICD-10-CM

## 2011-07-03 DIAGNOSIS — I10 Essential (primary) hypertension: Secondary | ICD-10-CM | POA: Insufficient documentation

## 2011-07-03 MED ORDER — AZITHROMYCIN 250 MG PO TABS: ORAL_TABLET | ORAL | Status: AC

## 2011-07-03 NOTE — Progress Notes (Signed)
  Subjective:    Patient ID: [name] [name], female    DOB: [birth year], 60 y.o.   MRN: [id number]  HPI  C/o B ear discomfort F/u mild elev of BP  BP Readings from Last 3 Encounters:  07/03/11 130/80  02/27/11 130/90  11/20/10 130/78     Review of Systems  HENT: Positive for congestion. Negative for sneezing.   Respiratory: Negative for cough.   Psychiatric/Behavioral: The patient is not nervous/anxious.        Objective:   Physical Exam  Constitutional: She appears well-developed and well-nourished. No distress.  HENT:  Head: Normocephalic.  Right Ear: External ear normal.  Left Ear: External ear normal.  Nose: Nose normal.  Mouth/Throat: Oropharynx is clear and moist.       B TMs w/fluid  Eyes: Conjunctivae are normal. Pupils are equal, round, and reactive to light. Right eye exhibits no discharge. Left eye exhibits no discharge.  Neck: Normal range of motion. Neck supple. No JVD present. No tracheal deviation present. No thyromegaly present.  Cardiovascular: Normal rate, regular rhythm and normal heart sounds.   Pulmonary/Chest: No stridor. No respiratory distress. She has no wheezes.  Abdominal: Soft. Bowel sounds are normal. She exhibits no distension and no mass. There is no tenderness. There is no rebound and no guarding.  Musculoskeletal: She exhibits no edema and no tenderness.  Lymphadenopathy:    She has no cervical adenopathy.  Neurological: She displays normal reflexes. No cranial nerve deficit. She exhibits normal muscle tone. Coordination normal.  Skin: No rash noted. No erythema.  Psychiatric: She has a normal mood and affect. Her behavior is normal. Judgment and thought content normal.          Assessment & Plan:

## 2011-07-03 NOTE — Assessment & Plan Note (Signed)
Normal BP at home.

## 2011-07-03 NOTE — Assessment & Plan Note (Signed)
Z pac if worse Continue with current prescription therapy as reflected on the Med list.

## 2011-09-11 ENCOUNTER — Telehealth: Payer: Self-pay | Admitting: *Deleted

## 2011-09-11 DIAGNOSIS — Z0000 Encounter for general adult medical examination without abnormal findings: Secondary | ICD-10-CM

## 2011-09-11 NOTE — Telephone Encounter (Signed)
Message copied by Merrilyn Puma on Thu Sep 11, 2011  1:31 PM ------      Message from: COUSIN, SHARON T      Created: Thu Sep 11, 2011  9:31 AM      Regarding: PHYSICAL DATE       03/02/12--PHY DATE

## 2011-09-11 NOTE — Telephone Encounter (Signed)
Labs entered for 02/2012 CPE.  

## 2011-12-02 ENCOUNTER — Other Ambulatory Visit: Payer: Self-pay | Admitting: *Deleted

## 2011-12-02 MED ORDER — RANITIDINE HCL 150 MG PO TABS: 150.0000 mg | ORAL_TABLET | Freq: Two times a day (BID) | ORAL | Status: DC

## 2012-02-24 ENCOUNTER — Other Ambulatory Visit (INDEPENDENT_AMBULATORY_CARE_PROVIDER_SITE_OTHER): Payer: BC Managed Care – PPO

## 2012-02-24 DIAGNOSIS — Z0000 Encounter for general adult medical examination without abnormal findings: Secondary | ICD-10-CM

## 2012-02-24 LAB — CBC WITH DIFFERENTIAL/PLATELET
Basophils Absolute: 0 10*3/uL (ref 0.0–0.1)
Eosinophils Absolute: 0.1 10*3/uL (ref 0.0–0.7)
HCT: 41.3 % (ref 36.0–46.0)
Hemoglobin: 13.9 g/dL (ref 12.0–15.0)
Lymphs Abs: 2.1 10*3/uL (ref 0.7–4.0)
MCHC: 33.8 g/dL (ref 30.0–36.0)
Neutro Abs: 3.1 10*3/uL (ref 1.4–7.7)
RDW: 13.7 % (ref 11.5–14.6)

## 2012-02-24 LAB — URINALYSIS, ROUTINE W REFLEX MICROSCOPIC
Bilirubin Urine: NEGATIVE
Hgb urine dipstick: NEGATIVE
Ketones, ur: NEGATIVE
Nitrite: NEGATIVE
Total Protein, Urine: NEGATIVE
pH: 8 (ref 5.0–8.0)

## 2012-02-24 LAB — BASIC METABOLIC PANEL
CO2: 27 mEq/L (ref 19–32)
GFR: 105.65 mL/min (ref >60.00)
Glucose, Bld: 89 mg/dL (ref 70–99)
Potassium: 4.1 mEq/L (ref 3.5–5.1)
Sodium: 144 mEq/L (ref 135–145)

## 2012-02-24 LAB — HEPATIC FUNCTION PANEL
AST: 22 U/L (ref 0–37)
Albumin: 4 g/dL (ref 3.5–5.2)
Alkaline Phosphatase: 42 U/L (ref 39–117)

## 2012-02-24 LAB — LIPID PANEL: VLDL: 21.2 mg/dL (ref 0.0–40.0)

## 2012-03-02 ENCOUNTER — Encounter: Payer: Self-pay | Admitting: Internal Medicine

## 2012-03-02 ENCOUNTER — Ambulatory Visit (INDEPENDENT_AMBULATORY_CARE_PROVIDER_SITE_OTHER): Payer: BC Managed Care – PPO | Admitting: Internal Medicine

## 2012-03-02 VITALS — BP 150/80 | HR 80 | Temp 97.5°F | Resp 16 | Ht 66.0 in | Wt 145.0 lb

## 2012-03-02 DIAGNOSIS — J45909 Unspecified asthma, uncomplicated: Secondary | ICD-10-CM

## 2012-03-02 DIAGNOSIS — IMO0001 Reserved for inherently not codable concepts without codable children: Secondary | ICD-10-CM

## 2012-03-02 DIAGNOSIS — R030 Elevated blood-pressure reading, without diagnosis of hypertension: Secondary | ICD-10-CM

## 2012-03-02 DIAGNOSIS — Z0000 Encounter for general adult medical examination without abnormal findings: Secondary | ICD-10-CM

## 2012-03-02 DIAGNOSIS — E785 Hyperlipidemia, unspecified: Secondary | ICD-10-CM

## 2012-03-02 MED ORDER — RANITIDINE HCL 150 MG PO TABS: 150.0000 mg | ORAL_TABLET | Freq: Two times a day (BID) | ORAL | Status: DC

## 2012-03-02 MED ORDER — ATORVASTATIN CALCIUM 10 MG PO TABS: 10.0000 mg | ORAL_TABLET | Freq: Every day | ORAL | Status: DC

## 2012-03-02 NOTE — Assessment & Plan Note (Signed)
Continue with current prescription therapy as reflected on the Med list.  

## 2012-03-02 NOTE — Progress Notes (Signed)
Patient ID: [name] [name], female   DOB: [birth year], 61 y.o.   MRN: [id number]  Subjective:    Patient ID: [name] [name], female    DOB: 06-12-1951, 61 y.o.   MRN: [id number]  HPI  The patient is here for a wellness exam. The patient has been doing well overall without major physical or psychological issues going on lately.  The patient needs to address  allergies that has been well controlled with medicines; to address chronic  hyperlipidemia controlled with medicines well; and to address GERD, controlled with medical treatment and diet.  BP Readings from Last 3 Encounters:  03/02/12 150/80  07/03/11 130/80  02/27/11 130/90   Wt Readings from Last 3 Encounters:  03/02/12 145 lb (65.772 kg)  07/03/11 145 lb (65.772 kg)  02/27/11 143 lb (64.864 kg)      Review of Systems  Constitutional: Negative for fever, chills, diaphoresis, activity change, appetite change, fatigue and unexpected weight change.  HENT: Negative for hearing loss, ear pain, congestion, sore throat, sneezing, mouth sores, neck pain, dental problem, voice change, postnasal drip and sinus pressure.   Eyes: Negative for pain and visual disturbance.  Respiratory: Negative for cough, chest tightness, wheezing and stridor.   Cardiovascular: Negative for chest pain, palpitations and leg swelling.  Gastrointestinal: Negative for nausea, vomiting, abdominal pain, blood in stool, abdominal distention and rectal pain.  Genitourinary: Negative for dysuria, hematuria, decreased urine volume, vaginal bleeding, vaginal discharge, difficulty urinating, vaginal pain and menstrual problem.  Musculoskeletal: Negative for back pain, joint swelling and gait problem.  Skin: Negative for color change, rash and wound.  Neurological: Positive for dizziness (occasional). Negative for tremors, syncope, speech difficulty and light-headedness.  Hematological: Negative for adenopathy.  Psychiatric/Behavioral: Negative for suicidal ideas,  hallucinations, behavioral problems, confusion, disturbed wake/sleep cycle, dysphoric mood and decreased concentration. The patient is not hyperactive.        Objective:   Physical Exam  Constitutional: She appears well-developed and well-nourished. No distress.  HENT:  Head: Normocephalic.  Right Ear: External ear normal.  Left Ear: External ear normal.  Nose: Nose normal.  Mouth/Throat: Oropharynx is clear and moist.  Eyes: Conjunctivae are normal. Pupils are equal, round, and reactive to light. Right eye exhibits no discharge. Left eye exhibits no discharge.  Neck: Normal range of motion. Neck supple. No JVD present. No tracheal deviation present. No thyromegaly present.  Cardiovascular: Normal rate, regular rhythm and normal heart sounds.   Pulmonary/Chest: No stridor. No respiratory distress. She has no wheezes.  Abdominal: Soft. Bowel sounds are normal. She exhibits no distension and no mass. There is no tenderness. There is no rebound and no guarding.  Musculoskeletal: She exhibits no edema and no tenderness.  Lymphadenopathy:    She has no cervical adenopathy.  Neurological: She displays normal reflexes. No cranial nerve deficit. She exhibits normal muscle tone. Coordination normal.  Skin: No rash noted. No erythema.  Psychiatric: She has a normal mood and affect. Her behavior is normal. Judgment and thought content normal.       Lab Results  Component Value Date   WBC 5.7 02/24/2012   HGB 13.9 02/24/2012   HCT 41.3 02/24/2012   PLT 291.0 02/24/2012   GLUCOSE 89 02/24/2012   CHOL 179 02/24/2012   TRIG 106.0 02/24/2012   HDL 62.80 02/24/2012   LDLCALC 95 02/24/2012   ALT 20 02/24/2012   AST 22 02/24/2012   NA 144 02/24/2012   K 4.1 02/24/2012   CL 108 02/24/2012  CREATININE 0.6 02/24/2012   BUN 14 02/24/2012   CO2 27 02/24/2012   TSH 1.38 02/24/2012      Assessment & Plan:

## 2012-03-02 NOTE — Assessment & Plan Note (Signed)
Nl BP at home 

## 2012-03-02 NOTE — Assessment & Plan Note (Addendum)
We discussed age appropriate health related issues, including available/recomended screening tests and vaccinations. We discussed a need for adhering to healthy diet and exercise. Labs/EKG were reviewed/ordered. All questions were answered.  Zostavax adviced 

## 2012-04-20 ENCOUNTER — Other Ambulatory Visit: Payer: Self-pay | Admitting: Gynecology

## 2012-04-20 DIAGNOSIS — Z1231 Encounter for screening mammogram for malignant neoplasm of breast: Secondary | ICD-10-CM

## 2012-05-31 ENCOUNTER — Ambulatory Visit
Admission: RE | Admit: 2012-05-31 | Discharge: 2012-05-31 | Disposition: A | Discharge status: Home / Self Care | Payer: BC Managed Care – PPO | Source: Ambulatory Visit | Attending: Gynecology | Admitting: Gynecology

## 2012-05-31 DIAGNOSIS — Z1231 Encounter for screening mammogram for malignant neoplasm of breast: Secondary | ICD-10-CM

## 2012-07-06 ENCOUNTER — Ambulatory Visit (INDEPENDENT_AMBULATORY_CARE_PROVIDER_SITE_OTHER): Payer: BC Managed Care – PPO

## 2012-07-06 DIAGNOSIS — Z23 Encounter for immunization: Secondary | ICD-10-CM

## 2012-12-17 ENCOUNTER — Telehealth: Payer: Self-pay | Admitting: *Deleted

## 2012-12-17 DIAGNOSIS — Z0000 Encounter for general adult medical examination without abnormal findings: Secondary | ICD-10-CM

## 2012-12-17 NOTE — Telephone Encounter (Signed)
Received fax pt made cpx for June. Entering cpx labs.Marland Kitchenlmb

## 2012-12-17 NOTE — Telephone Encounter (Signed)
Message copied by Deatra James on Fri Dec 17, 2012  2:13 PM ------      Message from: Etheleen Sia      Created: Fri Dec 17, 2012  8:42 AM      Regarding: LAB       PHYSICAL LAB IN [place] ------

## 2013-02-17 ENCOUNTER — Other Ambulatory Visit (INDEPENDENT_AMBULATORY_CARE_PROVIDER_SITE_OTHER): Payer: BC Managed Care – PPO

## 2013-02-17 DIAGNOSIS — Z0000 Encounter for general adult medical examination without abnormal findings: Secondary | ICD-10-CM

## 2013-02-17 LAB — BASIC METABOLIC PANEL
BUN: 15 mg/dL (ref 6–23)
CO2: 24 mEq/L (ref 19–32)
GFR: 96.16 mL/min (ref >60.00)
Glucose, Bld: 87 mg/dL (ref 70–99)
Potassium: 4.5 mEq/L (ref 3.5–5.1)
Sodium: 142 mEq/L (ref 135–145)

## 2013-02-17 LAB — URINALYSIS, ROUTINE W REFLEX MICROSCOPIC
Bilirubin Urine: NEGATIVE
Ketones, ur: NEGATIVE
Leukocytes, UA: NEGATIVE
Nitrite: NEGATIVE
Urobilinogen, UA: 0.2 (ref 0.0–1.0)
pH: 7 (ref 5.0–8.0)

## 2013-02-17 LAB — CBC WITH DIFFERENTIAL/PLATELET
Basophils Absolute: 0.1 10*3/uL (ref 0.0–0.1)
Eosinophils Absolute: 0.1 10*3/uL (ref 0.0–0.7)
HCT: 41.7 % (ref 36.0–46.0)
Hemoglobin: 14.1 g/dL (ref 12.0–15.0)
Lymphs Abs: 2.2 10*3/uL (ref 0.7–4.0)
MCHC: 33.8 g/dL (ref 30.0–36.0)
MCV: 88.6 fl (ref 78.0–100.0)
Monocytes Absolute: 0.4 10*3/uL (ref 0.1–1.0)
Monocytes Relative: 7.2 % (ref 3.0–12.0)
Neutro Abs: 3.5 10*3/uL (ref 1.4–7.7)
Platelets: 288 10*3/uL (ref 150.0–400.0)
RDW: 13.9 % (ref 11.5–14.6)

## 2013-02-17 LAB — HEPATIC FUNCTION PANEL
ALT: 21 U/L (ref 0–35)
AST: 20 U/L (ref 0–37)
Albumin: 3.9 g/dL (ref 3.5–5.2)
Alkaline Phosphatase: 37 U/L — ABNORMAL LOW (ref 39–117)

## 2013-02-17 LAB — LIPID PANEL
Cholesterol: 196 mg/dL (ref 0–200)
HDL: 57.8 mg/dL (ref >39.00)
VLDL: 21.2 mg/dL (ref 0.0–40.0)

## 2013-02-17 LAB — TSH: TSH: 1.16 u[IU]/mL (ref 0.35–5.50)

## 2013-02-25 ENCOUNTER — Ambulatory Visit (INDEPENDENT_AMBULATORY_CARE_PROVIDER_SITE_OTHER): Payer: BC Managed Care – PPO | Admitting: Internal Medicine

## 2013-02-25 ENCOUNTER — Encounter: Payer: Self-pay | Admitting: Internal Medicine

## 2013-02-25 VITALS — BP 130/80 | HR 76 | Temp 97.1°F | Resp 16 | Ht 66.0 in | Wt 147.0 lb

## 2013-02-25 DIAGNOSIS — E785 Hyperlipidemia, unspecified: Secondary | ICD-10-CM

## 2013-02-25 DIAGNOSIS — Z0000 Encounter for general adult medical examination without abnormal findings: Secondary | ICD-10-CM

## 2013-02-25 DIAGNOSIS — M1990 Unspecified osteoarthritis, unspecified site: Secondary | ICD-10-CM

## 2013-02-25 MED ORDER — BUTALBITAL-APAP-CAFF-COD 50-325-40-30 MG PO CAPS: 1.0000 | ORAL_CAPSULE | Freq: Four times a day (QID) | ORAL | Status: DC | PRN

## 2013-02-25 MED ORDER — RANITIDINE HCL 150 MG PO TABS: 150.0000 mg | ORAL_TABLET | Freq: Two times a day (BID) | ORAL | Status: DC

## 2013-02-25 MED ORDER — ATORVASTATIN CALCIUM 10 MG PO TABS: 10.0000 mg | ORAL_TABLET | Freq: Every day | ORAL | Status: DC

## 2013-02-25 NOTE — Assessment & Plan Note (Signed)
We discussed age appropriate health related issues, including available/recomended screening tests and vaccinations. We discussed a need for adhering to healthy diet and exercise. Labs/EKG were reviewed/ordered. All questions were answered.  Mammo q 12 mo

## 2013-02-25 NOTE — Assessment & Plan Note (Signed)
Continue with current prescription therapy as reflected on the Med list.  

## 2013-02-25 NOTE — Progress Notes (Signed)
   Subjective:    HPI  The patient is here for a wellness exam. The patient has been doing well overall without major physical or psychological issues going on lately. SBP 140-150 at home  The patient needs to address  allergies that has been well controlled with medicines; to address chronic  hyperlipidemia controlled with medicines well; and to address GERD, controlled with medical treatment and diet.  BP Readings from Last 3 Encounters:  02/25/13 130/80  03/02/12 150/80  07/03/11 130/80    Wt Readings from Last 3 Encounters:  02/25/13 147 lb (66.679 kg)  03/02/12 145 lb (65.772 kg)  07/03/11 145 lb (65.772 kg)      Review of Systems  Constitutional: Negative for fever, chills, diaphoresis, activity change, appetite change, fatigue and unexpected weight change.  HENT: Negative for hearing loss, ear pain, congestion, sore throat, sneezing, mouth sores, neck pain, dental problem, voice change, postnasal drip and sinus pressure.   Eyes: Negative for pain and visual disturbance.  Respiratory: Negative for cough, chest tightness, wheezing and stridor.   Cardiovascular: Negative for chest pain, palpitations and leg swelling.  Gastrointestinal: Negative for nausea, vomiting, abdominal pain, blood in stool, abdominal distention and rectal pain.  Genitourinary: Negative for dysuria, hematuria, decreased urine volume, vaginal bleeding, vaginal discharge, difficulty urinating, vaginal pain and menstrual problem.  Musculoskeletal: Negative for back pain, joint swelling and gait problem.  Skin: Negative for color change, rash and wound.  Neurological: Positive for dizziness (occasional). Negative for tremors, syncope, speech difficulty and light-headedness.  Hematological: Negative for adenopathy.  Psychiatric/Behavioral: Negative for suicidal ideas, hallucinations, behavioral problems, confusion, sleep disturbance, dysphoric mood and decreased concentration. The patient is not hyperactive.         Objective:   Physical Exam  Constitutional: She appears well-developed and well-nourished. No distress.  HENT:  Head: Normocephalic.  Right Ear: External ear normal.  Left Ear: External ear normal.  Nose: Nose normal.  Mouth/Throat: Oropharynx is clear and moist.  Eyes: Conjunctivae are normal. Pupils are equal, round, and reactive to light. Right eye exhibits no discharge. Left eye exhibits no discharge.  Neck: Normal range of motion. Neck supple. No JVD present. No tracheal deviation present. No thyromegaly present.  Cardiovascular: Normal rate, regular rhythm and normal heart sounds.   Pulmonary/Chest: No stridor. No respiratory distress. She has no wheezes.  Abdominal: Soft. Bowel sounds are normal. She exhibits no distension and no mass. There is no tenderness. There is no rebound and no guarding.  Musculoskeletal: She exhibits no edema and no tenderness.  Lymphadenopathy:    She has no cervical adenopathy.  Neurological: She displays normal reflexes. No cranial nerve deficit. She exhibits normal muscle tone. Coordination normal.  Skin: No rash noted. No erythema.  Psychiatric: She has a normal mood and affect. Her behavior is normal. Judgment and thought content normal.       Lab Results  Component Value Date   WBC 6.2 02/17/2013   HGB 14.1 02/17/2013   HCT 41.7 02/17/2013   PLT 288.0 02/17/2013   GLUCOSE 87 02/17/2013   CHOL 196 02/17/2013   TRIG 106.0 02/17/2013   HDL 57.80 02/17/2013   LDLCALC 117* 02/17/2013   ALT 21 02/17/2013   AST 20 02/17/2013   NA 142 02/17/2013   K 4.5 02/17/2013   CL 109 02/17/2013   CREATININE 0.7 02/17/2013   BUN 15 02/17/2013   CO2 24 02/17/2013   TSH 1.16 02/17/2013      Assessment & Plan:

## 2013-03-04 ENCOUNTER — Encounter: Payer: BC Managed Care – PPO | Admitting: Internal Medicine

## 2013-04-12 ENCOUNTER — Other Ambulatory Visit: Payer: Self-pay

## 2013-04-12 DIAGNOSIS — Z1231 Encounter for screening mammogram for malignant neoplasm of breast: Secondary | ICD-10-CM

## 2013-06-01 ENCOUNTER — Ambulatory Visit
Admission: RE | Admit: 2013-06-01 | Discharge: 2013-06-01 | Disposition: A | Discharge status: Home / Self Care | Payer: BC Managed Care – PPO | Source: Ambulatory Visit

## 2013-06-01 DIAGNOSIS — Z1231 Encounter for screening mammogram for malignant neoplasm of breast: Secondary | ICD-10-CM

## 2013-06-08 ENCOUNTER — Ambulatory Visit (INDEPENDENT_AMBULATORY_CARE_PROVIDER_SITE_OTHER): Payer: BC Managed Care – PPO

## 2013-06-08 DIAGNOSIS — Z23 Encounter for immunization: Secondary | ICD-10-CM

## 2013-10-09 LAB — HM PAP SMEAR

## 2014-02-27 ENCOUNTER — Encounter: Payer: Self-pay | Admitting: Internal Medicine

## 2014-02-27 ENCOUNTER — Ambulatory Visit (INDEPENDENT_AMBULATORY_CARE_PROVIDER_SITE_OTHER): Payer: BC Managed Care – PPO | Admitting: Internal Medicine

## 2014-02-27 ENCOUNTER — Other Ambulatory Visit (INDEPENDENT_AMBULATORY_CARE_PROVIDER_SITE_OTHER): Payer: BC Managed Care – PPO

## 2014-02-27 VITALS — BP 160/86 | HR 64 | Temp 98.6°F | Resp 16 | Ht 66.0 in | Wt 148.0 lb

## 2014-02-27 DIAGNOSIS — Z0000 Encounter for general adult medical examination without abnormal findings: Secondary | ICD-10-CM

## 2014-02-27 LAB — BASIC METABOLIC PANEL
BUN: 13 mg/dL (ref 6–23)
CHLORIDE: 106 meq/L (ref 96–112)
CO2: 28 mEq/L (ref 19–32)
Calcium: 9.5 mg/dL (ref 8.4–10.5)
Creatinine, Ser: 0.6 mg/dL (ref 0.4–1.2)
GFR: 99.31 mL/min (ref >60.00)
Glucose, Bld: 87 mg/dL (ref 70–99)
Potassium: 4.2 mEq/L (ref 3.5–5.1)
SODIUM: 141 meq/L (ref 135–145)

## 2014-02-27 LAB — CBC WITH DIFFERENTIAL/PLATELET
BASOS ABS: 0 10*3/uL (ref 0.0–0.1)
BASOS PCT: 0.8 % (ref 0.0–3.0)
EOS ABS: 0.1 10*3/uL (ref 0.0–0.7)
Eosinophils Relative: 0.9 % (ref 0.0–5.0)
HCT: 43.1 % (ref 36.0–46.0)
Hemoglobin: 14.4 g/dL (ref 12.0–15.0)
LYMPHS PCT: 30.5 % (ref 12.0–46.0)
Lymphs Abs: 1.8 10*3/uL (ref 0.7–4.0)
MCHC: 33.5 g/dL (ref 30.0–36.0)
MCV: 87.4 fl (ref 78.0–100.0)
MONO ABS: 0.4 10*3/uL (ref 0.1–1.0)
Monocytes Relative: 7.4 % (ref 3.0–12.0)
NEUTROS PCT: 60.4 % (ref 43.0–77.0)
Neutro Abs: 3.6 10*3/uL (ref 1.4–7.7)
PLATELETS: 307 10*3/uL (ref 150.0–400.0)
RBC: 4.93 Mil/uL (ref 3.87–5.11)
RDW: 13.8 % (ref 11.5–15.5)
WBC: 5.9 10*3/uL (ref 4.0–10.5)

## 2014-02-27 LAB — URINALYSIS
Bilirubin Urine: NEGATIVE
Hgb urine dipstick: NEGATIVE
Ketones, ur: NEGATIVE
Leukocytes, UA: NEGATIVE
Nitrite: NEGATIVE
Specific Gravity, Urine: 1.01
Total Protein, Urine: NEGATIVE
Urine Glucose: NEGATIVE
Urobilinogen, UA: 0.2
pH: 7.5 (ref 5.0–8.0)

## 2014-02-27 LAB — LIPID PANEL
Cholesterol: 196 mg/dL (ref 0–200)
HDL: 68.1 mg/dL
LDL Cholesterol: 108 mg/dL — ABNORMAL HIGH (ref 0–99)
NonHDL: 127.9
Total CHOL/HDL Ratio: 3
Triglycerides: 102 mg/dL (ref 0.0–149.0)
VLDL: 20.4 mg/dL (ref 0.0–40.0)

## 2014-02-27 LAB — HEPATIC FUNCTION PANEL
ALT: 19 U/L (ref 0–35)
AST: 19 U/L (ref 0–37)
Albumin: 4.4 g/dL (ref 3.5–5.2)
Alkaline Phosphatase: 41 U/L (ref 39–117)
Bilirubin, Direct: 0.1 mg/dL (ref 0.0–0.3)
Total Bilirubin: 0.5 mg/dL (ref 0.2–1.2)
Total Protein: 7.5 g/dL (ref 6.0–8.3)

## 2014-02-27 LAB — TSH: TSH: 1.4 u[IU]/mL (ref 0.35–4.50)

## 2014-02-27 MED ORDER — BUTALBITAL-APAP-CAFFEINE 50-325-40 MG PO TABS: 1.0000 | ORAL_TABLET | Freq: Two times a day (BID) | ORAL | Status: DC | PRN

## 2014-02-27 NOTE — Progress Notes (Signed)
   Subjective:    HPI  The patient is here for a wellness exam. The patient has been doing well overall without major physical or psychological issues going on lately. SBP 120-150 at home  The patient needs to address  allergies that has been well controlled with medicines; to address chronic  hyperlipidemia controlled with medicines well; and to address GERD, controlled with medical treatment and diet.  BP Readings from Last 3 Encounters:  02/27/14 160/86  02/25/13 130/80  03/02/12 150/80    Wt Readings from Last 3 Encounters:  02/27/14 148 lb (67.132 kg)  02/25/13 147 lb (66.679 kg)  03/02/12 145 lb (65.772 kg)      Review of Systems  Constitutional: Negative for fever, chills, diaphoresis, activity change, appetite change, fatigue and unexpected weight change.  HENT: Negative for congestion, dental problem, ear pain, hearing loss, mouth sores, postnasal drip, sinus pressure, sneezing, sore throat and voice change.   Eyes: Negative for pain and visual disturbance.  Respiratory: Negative for cough, chest tightness, wheezing and stridor.   Cardiovascular: Negative for chest pain, palpitations and leg swelling.  Gastrointestinal: Negative for nausea, vomiting, abdominal pain, blood in stool, abdominal distention and rectal pain.  Genitourinary: Negative for dysuria, hematuria, decreased urine volume, vaginal bleeding, vaginal discharge, difficulty urinating, vaginal pain and menstrual problem.  Musculoskeletal: Negative for back pain, gait problem, joint swelling and neck pain.  Skin: Negative for color change, rash and wound.  Neurological: Positive for dizziness (occasional). Negative for tremors, syncope, speech difficulty and light-headedness.  Hematological: Negative for adenopathy.  Psychiatric/Behavioral: Negative for suicidal ideas, hallucinations, behavioral problems, confusion, sleep disturbance, dysphoric mood and decreased concentration. The patient is not hyperactive.         Objective:   Physical Exam  Constitutional: She appears well-developed and well-nourished. No distress.  HENT:  Head: Normocephalic.  Right Ear: External ear normal.  Left Ear: External ear normal.  Nose: Nose normal.  Mouth/Throat: Oropharynx is clear and moist.  Eyes: Conjunctivae are normal. Pupils are equal, round, and reactive to light. Right eye exhibits no discharge. Left eye exhibits no discharge.  Neck: Normal range of motion. Neck supple. No JVD present. No tracheal deviation present. No thyromegaly present.  Cardiovascular: Normal rate, regular rhythm and normal heart sounds.   Pulmonary/Chest: No stridor. No respiratory distress. She has no wheezes.  Abdominal: Soft. Bowel sounds are normal. She exhibits no distension and no mass. There is no tenderness. There is no rebound and no guarding.  Musculoskeletal: She exhibits no edema and no tenderness.  Lymphadenopathy:    She has no cervical adenopathy.  Neurological: She displays normal reflexes. No cranial nerve deficit. She exhibits normal muscle tone. Coordination normal.  Skin: No rash noted. No erythema.  Psychiatric: She has a normal mood and affect. Her behavior is normal. Judgment and thought content normal.       Lab Results  Component Value Date   WBC 6.2 02/17/2013   HGB 14.1 02/17/2013   HCT 41.7 02/17/2013   PLT 288.0 02/17/2013   GLUCOSE 87 02/17/2013   CHOL 196 02/17/2013   TRIG 106.0 02/17/2013   HDL 57.80 02/17/2013   LDLCALC 117* 02/17/2013   ALT 21 02/17/2013   AST 20 02/17/2013   NA 142 02/17/2013   K 4.5 02/17/2013   CL 109 02/17/2013   CREATININE 0.7 02/17/2013   BUN 15 02/17/2013   CO2 24 02/17/2013   TSH 1.16 02/17/2013      Assessment & Plan:

## 2014-02-27 NOTE — Progress Notes (Signed)
Pre visit review using our clinic review tool, if applicable. No additional management support is needed unless otherwise documented below in the visit note. 

## 2014-02-27 NOTE — Assessment & Plan Note (Signed)
We discussed age appropriate health related issues, including available/recomended screening tests and vaccinations. We discussed a need for adhering to healthy diet and exercise. Labs/EKG were reviewed/ordered. All questions were answered. Colon is due in 2020 Dr Marina GoodellPerry Had a PAP

## 2014-02-28 ENCOUNTER — Other Ambulatory Visit: Payer: Self-pay | Admitting: Internal Medicine

## 2014-04-10 ENCOUNTER — Other Ambulatory Visit: Payer: Self-pay

## 2014-04-10 DIAGNOSIS — Z1231 Encounter for screening mammogram for malignant neoplasm of breast: Secondary | ICD-10-CM

## 2014-06-05 ENCOUNTER — Ambulatory Visit
Admission: RE | Admit: 2014-06-05 | Discharge: 2014-06-05 | Disposition: A | Discharge status: Home / Self Care | Payer: BC Managed Care – PPO | Source: Ambulatory Visit

## 2014-06-05 DIAGNOSIS — Z1231 Encounter for screening mammogram for malignant neoplasm of breast: Secondary | ICD-10-CM

## 2014-06-13 ENCOUNTER — Ambulatory Visit (INDEPENDENT_AMBULATORY_CARE_PROVIDER_SITE_OTHER): Payer: BC Managed Care – PPO

## 2014-06-13 DIAGNOSIS — Z23 Encounter for immunization: Secondary | ICD-10-CM

## 2014-09-20 ENCOUNTER — Telehealth: Payer: Self-pay | Admitting: Internal Medicine

## 2014-09-20 DIAGNOSIS — Z0000 Encounter for general adult medical examination without abnormal findings: Secondary | ICD-10-CM

## 2014-09-20 NOTE — Telephone Encounter (Signed)
Is requesting lab work to be entered before June CPE.  States this is per [employer].

## 2014-09-21 NOTE — Telephone Encounter (Signed)
OK CMET, TSH, CBC, UA, Lipids Thx

## 2014-09-21 NOTE — Telephone Encounter (Signed)
Notified pt labs entered.../lmb 

## 2014-11-14 ENCOUNTER — Encounter: Payer: Self-pay | Admitting: Internal Medicine

## 2014-11-14 ENCOUNTER — Ambulatory Visit (INDEPENDENT_AMBULATORY_CARE_PROVIDER_SITE_OTHER)
Admission: RE | Admit: 2014-11-14 | Discharge: 2014-11-14 | Disposition: A | Discharge status: Home / Self Care | Payer: BC Managed Care – PPO | Source: Ambulatory Visit | Attending: Internal Medicine | Admitting: Internal Medicine

## 2014-11-14 ENCOUNTER — Ambulatory Visit (INDEPENDENT_AMBULATORY_CARE_PROVIDER_SITE_OTHER): Payer: BC Managed Care – PPO | Admitting: Internal Medicine

## 2014-11-14 VITALS — BP 166/90 | HR 94 | Temp 97.7°F | Wt 155.0 lb

## 2014-11-14 DIAGNOSIS — M546 Pain in thoracic spine: Secondary | ICD-10-CM | POA: Insufficient documentation

## 2014-11-14 MED ORDER — MELOXICAM 7.5 MG PO TABS: 7.5000 mg | ORAL_TABLET | Freq: Every day | ORAL | Status: DC

## 2014-11-14 NOTE — Progress Notes (Signed)
Pre visit review using our clinic review tool, if applicable. No additional management support is needed unless otherwise documented below in the visit note. 

## 2014-11-14 NOTE — Assessment & Plan Note (Signed)
3/16 [facility] Meloxicam X ray Stretch

## 2014-11-14 NOTE — Patient Instructions (Signed)
Stretch  

## 2014-11-14 NOTE — Progress Notes (Signed)
Subjective:    Back Pain This is a new problem. The current episode started more than 1 month ago. The pain is present in the thoracic spine. The quality of the pain is described as aching. The pain is moderate. The pain is worse during the day. The symptoms are aggravated by lying down, standing and twisting. Pertinent negatives include no abdominal pain, chest pain, dysuria, fever or weight loss. Risk factors include history of cancer. She has tried NSAIDs for the symptoms. The treatment provided mild relief.    The patient is here for a wellness exam. The patient has been doing well overall without major physical or psychological issues going on lately. SBP 120-150 at home  The patient needs to address  allergies that has been well controlled with medicines; to address chronic  hyperlipidemia controlled with medicines well; and to address GERD, controlled with medical treatment and diet.  BP Readings from Last 3 Encounters:  11/14/14 166/90  02/27/14 160/86  02/25/13 130/80    Wt Readings from Last 3 Encounters:  11/14/14 155 lb (70.308 kg)  02/27/14 148 lb (67.132 kg)  02/25/13 147 lb (66.679 kg)      Review of Systems  Constitutional: Negative for fever, chills, weight loss, diaphoresis, activity change, appetite change, fatigue and unexpected weight change.  HENT: Negative for congestion, dental problem, ear pain, hearing loss, mouth sores, postnasal drip, sinus pressure, sneezing, sore throat and voice change.   Eyes: Negative for pain and visual disturbance.  Respiratory: Negative for cough, chest tightness, wheezing and stridor.   Cardiovascular: Negative for chest pain, palpitations and leg swelling.  Gastrointestinal: Negative for nausea, vomiting, abdominal pain, blood in stool, abdominal distention and rectal pain.  Genitourinary: Negative for dysuria, hematuria, decreased urine volume, vaginal bleeding, vaginal discharge, difficulty urinating, vaginal pain and  menstrual problem.  Musculoskeletal: Positive for back pain. Negative for joint swelling, gait problem and neck pain.  Skin: Negative for color change, rash and wound.  Neurological: Positive for dizziness (occasional). Negative for tremors, syncope, speech difficulty and light-headedness.  Hematological: Negative for adenopathy.  Psychiatric/Behavioral: Negative for suicidal ideas, hallucinations, behavioral problems, confusion, sleep disturbance, dysphoric mood and decreased concentration. The patient is not hyperactive.        Objective:   Physical Exam  Constitutional: She appears well-developed and well-nourished. No distress.  HENT:  Head: Normocephalic.  Right Ear: External ear normal.  Left Ear: External ear normal.  Nose: Nose normal.  Mouth/Throat: Oropharynx is clear and moist.  Eyes: Conjunctivae are normal. Pupils are equal, round, and reactive to light. Right eye exhibits no discharge. Left eye exhibits no discharge.  Neck: Normal range of motion. Neck supple. No JVD present. No tracheal deviation present. No thyromegaly present.  Cardiovascular: Normal rate, regular rhythm and normal heart sounds.   Pulmonary/Chest: No stridor. No respiratory distress. She has no wheezes.  Abdominal: Soft. Bowel sounds are normal. She exhibits no distension and no mass. There is no tenderness. There is no rebound and no guarding.  Musculoskeletal: She exhibits no edema or tenderness.  Lymphadenopathy:    She has no cervical adenopathy.  Neurological: She displays normal reflexes. No cranial nerve deficit. She exhibits normal muscle tone. Coordination normal.  Skin: No rash noted. No erythema.  Psychiatric: She has a normal mood and affect. Her behavior is normal. Judgment and thought content normal.  lower thor spine is a little tender over parasp muscles     Lab Results  Component Value Date   WBC  5.9 02/27/2014   HGB 14.4 02/27/2014   HCT 43.1 02/27/2014   PLT 307.0 02/27/2014    GLUCOSE 87 02/27/2014   CHOL 196 02/27/2014   TRIG 102.0 02/27/2014   HDL 68.10 02/27/2014   LDLCALC 108* 02/27/2014   ALT 19 02/27/2014   AST 19 02/27/2014   NA 141 02/27/2014   K 4.2 02/27/2014   CL 106 02/27/2014   CREATININE 0.6 02/27/2014   BUN 13 02/27/2014   CO2 28 02/27/2014   TSH 1.40 02/27/2014      Assessment & Plan:

## 2014-11-30 ENCOUNTER — Other Ambulatory Visit: Payer: Self-pay | Admitting: Internal Medicine

## 2015-01-18 ENCOUNTER — Other Ambulatory Visit: Payer: Self-pay | Admitting: Internal Medicine

## 2015-02-13 ENCOUNTER — Other Ambulatory Visit (INDEPENDENT_AMBULATORY_CARE_PROVIDER_SITE_OTHER): Payer: BC Managed Care – PPO

## 2015-02-13 DIAGNOSIS — Z0000 Encounter for general adult medical examination without abnormal findings: Secondary | ICD-10-CM

## 2015-02-13 DIAGNOSIS — Z0189 Encounter for other specified special examinations: Secondary | ICD-10-CM

## 2015-02-13 LAB — CBC WITH DIFFERENTIAL/PLATELET
BASOS PCT: 0.7 % (ref 0.0–3.0)
Basophils Absolute: 0 10*3/uL (ref 0.0–0.1)
EOS PCT: 1.8 % (ref 0.0–5.0)
Eosinophils Absolute: 0.1 10*3/uL (ref 0.0–0.7)
HCT: 41.8 % (ref 36.0–46.0)
Hemoglobin: 14.1 g/dL (ref 12.0–15.0)
Lymphocytes Relative: 33.7 % (ref 12.0–46.0)
Lymphs Abs: 1.8 10*3/uL (ref 0.7–4.0)
MCHC: 33.8 g/dL (ref 30.0–36.0)
MCV: 86.8 fl (ref 78.0–100.0)
Monocytes Absolute: 0.4 10*3/uL (ref 0.1–1.0)
Monocytes Relative: 7.9 % (ref 3.0–12.0)
Neutro Abs: 3 10*3/uL (ref 1.4–7.7)
Neutrophils Relative %: 55.9 % (ref 43.0–77.0)
Platelets: 317 10*3/uL (ref 150.0–400.0)
RBC: 4.82 Mil/uL (ref 3.87–5.11)
RDW: 14.1 % (ref 11.5–15.5)
WBC: 5.4 10*3/uL (ref 4.0–10.5)

## 2015-02-13 LAB — LIPID PANEL
CHOLESTEROL: 181 mg/dL (ref 0–200)
HDL: 62.4 mg/dL (ref >39.00)
LDL Cholesterol: 103 mg/dL — ABNORMAL HIGH (ref 0–99)
NonHDL: 118.6
TRIGLYCERIDES: 78 mg/dL (ref 0.0–149.0)
Total CHOL/HDL Ratio: 3
VLDL: 15.6 mg/dL (ref 0.0–40.0)

## 2015-02-13 LAB — BASIC METABOLIC PANEL
BUN: 13 mg/dL (ref 6–23)
CO2: 28 meq/L (ref 19–32)
Calcium: 9.2 mg/dL (ref 8.4–10.5)
Chloride: 106 mEq/L (ref 96–112)
Creatinine, Ser: 0.72 mg/dL (ref 0.40–1.20)
GFR: 86.43 mL/min (ref >60.00)
GLUCOSE: 99 mg/dL (ref 70–99)
POTASSIUM: 4.3 meq/L (ref 3.5–5.1)
Sodium: 140 mEq/L (ref 135–145)

## 2015-02-13 LAB — URINALYSIS, ROUTINE W REFLEX MICROSCOPIC
Bilirubin Urine: NEGATIVE
Hgb urine dipstick: NEGATIVE
Ketones, ur: NEGATIVE
Leukocytes, UA: NEGATIVE
NITRITE: NEGATIVE
PH: 7 (ref 5.0–8.0)
Specific Gravity, Urine: <=1.005 — AB (ref 1.000–1.030)
Total Protein, Urine: NEGATIVE
URINE GLUCOSE: NEGATIVE
Urobilinogen, UA: 0.2 (ref 0.0–1.0)

## 2015-02-13 LAB — HEPATIC FUNCTION PANEL
ALBUMIN: 4.2 g/dL (ref 3.5–5.2)
ALK PHOS: 40 U/L (ref 39–117)
ALT: 11 U/L (ref 0–35)
AST: 15 U/L (ref 0–37)
BILIRUBIN DIRECT: 0.1 mg/dL (ref 0.0–0.3)
Total Bilirubin: 0.6 mg/dL (ref 0.2–1.2)
Total Protein: 7.1 g/dL (ref 6.0–8.3)

## 2015-02-13 LAB — TSH: TSH: 1.33 u[IU]/mL (ref 0.35–4.50)

## 2015-02-20 ENCOUNTER — Encounter: Payer: Self-pay | Admitting: Internal Medicine

## 2015-02-20 ENCOUNTER — Ambulatory Visit (INDEPENDENT_AMBULATORY_CARE_PROVIDER_SITE_OTHER): Payer: BC Managed Care – PPO | Admitting: Internal Medicine

## 2015-02-20 VITALS — BP 142/88 | HR 86 | Temp 97.0°F | Ht 66.0 in | Wt 150.8 lb

## 2015-02-20 DIAGNOSIS — Z23 Encounter for immunization: Secondary | ICD-10-CM

## 2015-02-20 DIAGNOSIS — R030 Elevated blood-pressure reading, without diagnosis of hypertension: Secondary | ICD-10-CM

## 2015-02-20 DIAGNOSIS — IMO0001 Reserved for inherently not codable concepts without codable children: Secondary | ICD-10-CM

## 2015-02-20 DIAGNOSIS — Z0000 Encounter for general adult medical examination without abnormal findings: Secondary | ICD-10-CM | POA: Diagnosis not present

## 2015-02-20 MED ORDER — LOSARTAN POTASSIUM 50 MG PO TABS: 50.0000 mg | ORAL_TABLET | Freq: Every day | ORAL | Status: DC

## 2015-02-20 MED ORDER — BUTALBITAL-APAP-CAFFEINE 50-325-40 MG PO TABS: 1.0000 | ORAL_TABLET | Freq: Two times a day (BID) | ORAL | Status: DC | PRN

## 2015-02-20 MED ORDER — ATORVASTATIN CALCIUM 10 MG PO TABS: 10.0000 mg | ORAL_TABLET | Freq: Every day | ORAL | Status: DC

## 2015-02-20 MED ORDER — RANITIDINE HCL 150 MG PO TABS: 150.0000 mg | ORAL_TABLET | Freq: Two times a day (BID) | ORAL | Status: DC

## 2015-02-20 NOTE — Progress Notes (Signed)
Subjective:    Back Pain This is a new problem. The current episode started more than 1 month ago. The pain is present in the thoracic spine. The quality of the pain is described as aching. The pain is moderate. The pain is worse during the day. The symptoms are aggravated by lying down, standing and twisting. Pertinent negatives include no abdominal pain, chest pain, dysuria, fever or weight loss. Risk factors include history of cancer. She has tried NSAIDs for the symptoms. The treatment provided mild relief.    The patient is here for a wellness exam. The patient has been doing well overall without major physical or psychological issues going on lately. SBP 120-150 at home  The patient needs to address  allergies that has been well controlled with medicines; to address chronic  hyperlipidemia controlled with medicines well; and to address GERD, controlled with medical treatment and diet.  BP Readings from Last 3 Encounters:  02/20/15 142/88  11/14/14 166/90  02/27/14 160/86   Wt Readings from Last 3 Encounters:  02/20/15 150 lb 12 oz (68.38 kg)  11/14/14 155 lb (70.308 kg)  02/27/14 148 lb (67.132 kg)      Review of Systems  Constitutional: Negative for fever, chills, weight loss, diaphoresis, activity change, appetite change, fatigue and unexpected weight change.  HENT: Negative for congestion, dental problem, ear pain, hearing loss, mouth sores, postnasal drip, sinus pressure, sneezing, sore throat and voice change.   Eyes: Negative for pain and visual disturbance.  Respiratory: Negative for cough, chest tightness, wheezing and stridor.   Cardiovascular: Negative for chest pain, palpitations and leg swelling.  Gastrointestinal: Negative for nausea, vomiting, abdominal pain, blood in stool, abdominal distention and rectal pain.  Genitourinary: Negative for dysuria, hematuria, decreased urine volume, vaginal bleeding, vaginal discharge, difficulty urinating, vaginal pain and  menstrual problem.  Musculoskeletal: Positive for back pain. Negative for joint swelling, gait problem and neck pain.  Skin: Negative for color change, rash and wound.  Neurological: Positive for dizziness (occasional). Negative for tremors, syncope, speech difficulty and light-headedness.  Hematological: Negative for adenopathy.  Psychiatric/Behavioral: Negative for suicidal ideas, hallucinations, behavioral problems, confusion, sleep disturbance, dysphoric mood and decreased concentration. The patient is not hyperactive.        Objective:   Physical Exam  Constitutional: She appears well-developed and well-nourished. No distress.  HENT:  Head: Normocephalic.  Right Ear: External ear normal.  Left Ear: External ear normal.  Nose: Nose normal.  Mouth/Throat: Oropharynx is clear and moist.  Eyes: Conjunctivae are normal. Pupils are equal, round, and reactive to light. Right eye exhibits no discharge. Left eye exhibits no discharge.  Neck: Normal range of motion. Neck supple. No JVD present. No tracheal deviation present. No thyromegaly present.  Cardiovascular: Normal rate, regular rhythm and normal heart sounds.   Pulmonary/Chest: No stridor. No respiratory distress. She has no wheezes.  Abdominal: Soft. Bowel sounds are normal. She exhibits no distension and no mass. There is no tenderness. There is no rebound and no guarding.  Musculoskeletal: She exhibits no edema or tenderness.  Lymphadenopathy:    She has no cervical adenopathy.  Neurological: She displays normal reflexes. No cranial nerve deficit. She exhibits normal muscle tone. Coordination normal.  Skin: No rash noted. No erythema.  Psychiatric: She has a normal mood and affect. Her behavior is normal. Judgment and thought content normal.  lower thor spine is a little tender over parasp muscles     Lab Results  Component Value Date  WBC 5.4 02/13/2015   HGB 14.1 02/13/2015   HCT 41.8 02/13/2015   PLT 317.0 02/13/2015    GLUCOSE 99 02/13/2015   CHOL 181 02/13/2015   TRIG 78.0 02/13/2015   HDL 62.40 02/13/2015   LDLCALC 103* 02/13/2015   ALT 11 02/13/2015   AST 15 02/13/2015   NA 140 02/13/2015   K 4.3 02/13/2015   CL 106 02/13/2015   CREATININE 0.72 02/13/2015   BUN 13 02/13/2015   CO2 28 02/13/2015   TSH 1.33 02/13/2015      Assessment & Plan:

## 2015-02-20 NOTE — Assessment & Plan Note (Signed)
Nl BP at home Start Losartan if elevated

## 2015-02-20 NOTE — Progress Notes (Signed)
Pre visit review using our clinic review tool, if applicable. No additional management support is needed unless otherwise documented below in the visit note. 

## 2015-02-23 NOTE — Assessment & Plan Note (Signed)
We discussed age appropriate health related issues, including available/recomended screening tests and vaccinations. We discussed a need for adhering to healthy diet and exercise. Labs/EKG were reviewed/ordered. All questions were answered. GYN, Mammo q 12 mo  Colon due in 2020 Dr Marina Goodell

## 2015-03-01 ENCOUNTER — Encounter: Payer: Self-pay | Admitting: Internal Medicine

## 2015-03-01 ENCOUNTER — Encounter: Payer: Self-pay | Admitting: Gastroenterology

## 2015-03-07 ENCOUNTER — Ambulatory Visit (INDEPENDENT_AMBULATORY_CARE_PROVIDER_SITE_OTHER): Payer: BC Managed Care – PPO

## 2015-03-07 DIAGNOSIS — Z23 Encounter for immunization: Secondary | ICD-10-CM | POA: Diagnosis not present

## 2015-03-09 ENCOUNTER — Encounter: Payer: Self-pay | Admitting: Internal Medicine

## 2015-04-19 ENCOUNTER — Other Ambulatory Visit: Payer: Self-pay

## 2015-04-19 DIAGNOSIS — Z1231 Encounter for screening mammogram for malignant neoplasm of breast: Secondary | ICD-10-CM

## 2015-06-12 ENCOUNTER — Ambulatory Visit
Admission: RE | Admit: 2015-06-12 | Discharge: 2015-06-12 | Disposition: A | Discharge status: Home / Self Care | Payer: Medicare Other | Source: Ambulatory Visit

## 2015-06-12 DIAGNOSIS — Z1231 Encounter for screening mammogram for malignant neoplasm of breast: Secondary | ICD-10-CM

## 2015-06-14 ENCOUNTER — Ambulatory Visit (INDEPENDENT_AMBULATORY_CARE_PROVIDER_SITE_OTHER): Payer: Medicare Other

## 2015-06-14 DIAGNOSIS — Z23 Encounter for immunization: Secondary | ICD-10-CM

## 2016-02-28 ENCOUNTER — Other Ambulatory Visit (INDEPENDENT_AMBULATORY_CARE_PROVIDER_SITE_OTHER): Payer: Medicare Other

## 2016-02-28 ENCOUNTER — Encounter: Payer: Self-pay | Admitting: Internal Medicine

## 2016-02-28 ENCOUNTER — Ambulatory Visit (INDEPENDENT_AMBULATORY_CARE_PROVIDER_SITE_OTHER): Payer: Medicare Other | Admitting: Internal Medicine

## 2016-02-28 VITALS — BP 129/80 | HR 85 | Ht 66.0 in | Wt 152.0 lb

## 2016-02-28 DIAGNOSIS — Z0000 Encounter for general adult medical examination without abnormal findings: Secondary | ICD-10-CM

## 2016-02-28 DIAGNOSIS — E785 Hyperlipidemia, unspecified: Secondary | ICD-10-CM

## 2016-02-28 DIAGNOSIS — Z23 Encounter for immunization: Secondary | ICD-10-CM

## 2016-02-28 DIAGNOSIS — J4520 Mild intermittent asthma, uncomplicated: Secondary | ICD-10-CM

## 2016-02-28 LAB — HEPATIC FUNCTION PANEL
ALK PHOS: 46 U/L (ref 39–117)
ALT: 13 U/L (ref 0–35)
AST: 16 U/L (ref 0–37)
Albumin: 4.4 g/dL (ref 3.5–5.2)
BILIRUBIN DIRECT: 0.1 mg/dL (ref 0.0–0.3)
BILIRUBIN TOTAL: 0.5 mg/dL (ref 0.2–1.2)
Total Protein: 7.3 g/dL (ref 6.0–8.3)

## 2016-02-28 LAB — URINALYSIS
BILIRUBIN URINE: NEGATIVE
Ketones, ur: NEGATIVE
Leukocytes, UA: NEGATIVE
Nitrite: NEGATIVE
PH: 7 (ref 5.0–8.0)
Specific Gravity, Urine: <=1.005 — AB (ref 1.000–1.030)
TOTAL PROTEIN, URINE-UPE24: NEGATIVE
URINE GLUCOSE: NEGATIVE
Urobilinogen, UA: 0.2 (ref 0.0–1.0)

## 2016-02-28 LAB — CBC WITH DIFFERENTIAL/PLATELET
BASOS ABS: 0.1 10*3/uL (ref 0.0–0.1)
Basophils Relative: 1 % (ref 0.0–3.0)
EOS ABS: 0 10*3/uL (ref 0.0–0.7)
EOS PCT: 0.7 % (ref 0.0–5.0)
HCT: 43.3 % (ref 36.0–46.0)
HEMOGLOBIN: 14.4 g/dL (ref 12.0–15.0)
Lymphocytes Relative: 27.7 % (ref 12.0–46.0)
Lymphs Abs: 1.8 10*3/uL (ref 0.7–4.0)
MCHC: 33.3 g/dL (ref 30.0–36.0)
MCV: 87 fl (ref 78.0–100.0)
Monocytes Absolute: 0.5 10*3/uL (ref 0.1–1.0)
Monocytes Relative: 7.1 % (ref 3.0–12.0)
Neutro Abs: 4 10*3/uL (ref 1.4–7.7)
Neutrophils Relative %: 63.5 % (ref 43.0–77.0)
Platelets: 322 10*3/uL (ref 150.0–400.0)
RBC: 4.98 Mil/uL (ref 3.87–5.11)
RDW: 14.2 % (ref 11.5–15.5)
WBC: 6.3 10*3/uL (ref 4.0–10.5)

## 2016-02-28 LAB — BASIC METABOLIC PANEL
BUN: 10 mg/dL (ref 6–23)
CO2: 30 mEq/L (ref 19–32)
CREATININE: 0.69 mg/dL (ref 0.40–1.20)
Calcium: 9.3 mg/dL (ref 8.4–10.5)
Chloride: 105 mEq/L (ref 96–112)
GFR: 90.49 mL/min (ref >60.00)
Glucose, Bld: 98 mg/dL (ref 70–99)
POTASSIUM: 4.5 meq/L (ref 3.5–5.1)
SODIUM: 141 meq/L (ref 135–145)

## 2016-02-28 LAB — LIPID PANEL
CHOL/HDL RATIO: 3
Cholesterol: 188 mg/dL (ref 0–200)
HDL: 60 mg/dL (ref >39.00)
LDL Cholesterol: 108 mg/dL — ABNORMAL HIGH (ref 0–99)
NonHDL: 128
Triglycerides: 101 mg/dL (ref 0.0–149.0)
VLDL: 20.2 mg/dL (ref 0.0–40.0)

## 2016-02-28 LAB — TSH: TSH: 1.36 u[IU]/mL (ref 0.35–4.50)

## 2016-02-28 MED ORDER — ATORVASTATIN CALCIUM 10 MG PO TABS: 10.0000 mg | ORAL_TABLET | Freq: Every day | ORAL | Status: DC

## 2016-02-28 MED ORDER — RANITIDINE HCL 150 MG PO TABS: 150.0000 mg | ORAL_TABLET | Freq: Two times a day (BID) | ORAL | Status: DC

## 2016-02-28 NOTE — Assessment & Plan Note (Signed)
Dr Otterbein CallasSharma On Virgel BouquetBreo

## 2016-02-28 NOTE — Progress Notes (Signed)
Subjective:  Patient ID: [name] [name], female    DOB: Jul 31, 1951  Age: 65 y.o. MRN: [id number]  CC: Annual Exam   HPI Curt Jewsamela B Morford presents for a well exam  Outpatient Prescriptions Prior to Visit  Medication Sig Dispense Refill  . butalbital-acetaminophen-caffeine (FIORICET) 50-325-40 MG per tablet Take 1-2 tablets by mouth 2 (two) times daily as needed for headache. 90 tablet 0  . cholecalciferol (VITAMIN D) 1000 UNITS tablet Take 1,000 Units by mouth daily.      Marland Kitchen. loratadine (CLARITIN) 10 MG tablet Take 10 mg by mouth daily.      Marland Kitchen. losartan (COZAAR) 50 MG tablet Take 1 tablet (50 mg total) by mouth daily. 30 tablet 11  . mometasone (NASONEX) 50 MCG/ACT nasal spray Place 2 sprays into the nose as directed.      . sennosides-docusate sodium (SENOKOT-S) 8.6-50 MG tablet Take 1 tablet by mouth 2 (two) times daily.      . Wheat Dextrin (BENEFIBER) POWD Take by mouth. 2 tsp daily.     Marland Kitchen. atorvastatin (LIPITOR) 10 MG tablet Take 1 tablet (10 mg total) by mouth daily. 90 tablet 3  . ranitidine (ZANTAC) 150 MG tablet Take 1 tablet (150 mg total) by mouth 2 (two) times daily. 180 tablet 3  . albuterol (PROVENTIL,VENTOLIN) 90 MCG/ACT inhaler Inhale 2 puffs into the lungs as needed. Reported on 02/28/2016    . Fluticasone-Salmeterol (ADVAIR DISKUS) 100-50 MCG/DOSE AEPB Inhale 1 puff into the lungs as directed. Reported on 02/28/2016     No facility-administered medications prior to visit.    ROS Review of Systems  Constitutional: Negative for chills, activity change, appetite change, fatigue and unexpected weight change.  HENT: Negative for congestion, mouth sores and sinus pressure.   Eyes: Negative for visual disturbance.  Respiratory: Negative for cough and chest tightness.   Gastrointestinal: Negative for nausea and abdominal pain.  Genitourinary: Negative for frequency, difficulty urinating and vaginal pain.  Musculoskeletal: Negative for back pain and gait problem.  Skin:  Negative for pallor and rash.  Neurological: Negative for dizziness, tremors, weakness, numbness and headaches.  Psychiatric/Behavioral: Negative for suicidal ideas, confusion and sleep disturbance.    Objective:  BP 148/82 mmHg  Pulse 85  Ht 5\' 6"  (1.676 m)  Wt 152 lb (68.947 kg)  BMI 24.55 kg/m2  SpO2 98%  BP Readings from Last 3 Encounters:  02/28/16 148/82  02/20/15 142/88  11/14/14 166/90    Wt Readings from Last 3 Encounters:  02/28/16 152 lb (68.947 kg)  02/20/15 150 lb 12 oz (68.38 kg)  11/14/14 155 lb (70.308 kg)    Physical Exam  Constitutional: She appears well-developed. No distress.  HENT:  Head: Normocephalic.  Right Ear: External ear normal.  Left Ear: External ear normal.  Nose: Nose normal.  Mouth/Throat: Oropharynx is clear and moist.  Eyes: Conjunctivae are normal. Pupils are equal, round, and reactive to light. Right eye exhibits no discharge. Left eye exhibits no discharge.  Neck: Normal range of motion. Neck supple. No JVD present. No tracheal deviation present. No thyromegaly present.  Cardiovascular: Normal rate, regular rhythm and normal heart sounds.   Pulmonary/Chest: No stridor. No respiratory distress. She has no wheezes.  Abdominal: Soft. Bowel sounds are normal. She exhibits no distension and no mass. There is no tenderness. There is no rebound and no guarding.  Musculoskeletal: She exhibits no edema or tenderness.  Lymphadenopathy:    She has no cervical adenopathy.  Neurological: She displays normal reflexes. No  cranial nerve deficit. She exhibits normal muscle tone. Coordination normal.  Skin: No rash noted. No erythema.  Psychiatric: She has a normal mood and affect. Her behavior is normal. Judgment and thought content normal.    Lab Results  Component Value Date   WBC 5.4 02/13/2015   HGB 14.1 02/13/2015   HCT 41.8 02/13/2015   PLT 317.0 02/13/2015   GLUCOSE 99 02/13/2015   CHOL 181 02/13/2015   TRIG 78.0 02/13/2015   HDL  62.40 02/13/2015   LDLCALC 103* 02/13/2015   ALT 11 02/13/2015   AST 15 02/13/2015   NA 140 02/13/2015   K 4.3 02/13/2015   CL 106 02/13/2015   CREATININE 0.72 02/13/2015   BUN 13 02/13/2015   CO2 28 02/13/2015   TSH 1.33 02/13/2015    Mm Digital Screening Bilateral  06/13/2015  CLINICAL DATA:  Screening. EXAM: DIGITAL SCREENING BILATERAL MAMMOGRAM WITH CAD COMPARISON:  Previous exam(s). ACR Breast Density Category b: There are scattered areas of fibroglandular density. FINDINGS: There are no findings suspicious for malignancy. Images were processed with CAD. IMPRESSION: No mammographic evidence of malignancy. A result letter of this screening mammogram will be mailed directly to the patient. RECOMMENDATION: Screening mammogram in one year. (Code:SM-B-01Y) BI-RADS CATEGORY  1: Negative. Electronically Signed   By: Ted Mcalpineobrinka  Dimitrova M.D.   On: 06/13/2015 09:23    Assessment & Plan:   There are no diagnoses linked to this encounter. I have discontinued Ms. Cavagnaro's Fluticasone-Salmeterol and albuterol. I am also having her maintain her BENEFIBER, loratadine, mometasone, sennosides-docusate sodium, cholecalciferol, butalbital-acetaminophen-caffeine, losartan, BREO ELLIPTA, Albuterol Sulfate, atorvastatin, and ranitidine.  Meds ordered this encounter  Medications  . BREO ELLIPTA 200-25 MCG/INH AEPB    Sig: Inhale 1 puff into the lungs daily.    Refill:  6  . Albuterol Sulfate (PROAIR RESPICLICK) 108 (90 Base) MCG/ACT AEPB    Sig: Inhale 2 puffs into the lungs as needed.  Marland Kitchen. atorvastatin (LIPITOR) 10 MG tablet    Sig: Take 1 tablet (10 mg total) by mouth daily.    Dispense:  90 tablet    Refill:  3  . ranitidine (ZANTAC) 150 MG tablet    Sig: Take 1 tablet (150 mg total) by mouth 2 (two) times daily.    Dispense:  180 tablet    Refill:  3     Follow-up: No Follow-up on file.  Sonda PrimesAlex Buster Schueller, MD

## 2016-02-28 NOTE — Patient Instructions (Signed)
Preventive Care for Adults, Female A healthy lifestyle and preventive care can promote health and wellness. Preventive health guidelines for women include the following key practices.  A routine yearly physical is a good way to check with your health care provider about your health and preventive screening. It is a chance to share any concerns and updates on your health and to receive a thorough exam.  Visit your dentist for a routine exam and preventive care every 6 months. Brush your teeth twice a day and floss once a day. Good oral hygiene prevents tooth decay and gum disease.  The frequency of eye exams is based on your age, health, family medical history, use of contact lenses, and other factors. Follow your health care provider's recommendations for frequency of eye exams.  Eat a healthy diet. Foods like vegetables, fruits, whole grains, low-fat dairy products, and lean protein foods contain the nutrients you need without too many calories. Decrease your intake of foods high in solid fats, added sugars, and salt. Eat the right amount of calories for you.Get information about a proper diet from your health care provider, if necessary.  Regular physical exercise is one of the most important things you can do for your health. Most adults should get at least 150 minutes of moderate-intensity exercise (any activity that increases your heart rate and causes you to sweat) each week. In addition, most adults need muscle-strengthening exercises on 2 or more days a week.  Maintain a healthy weight. The body mass index (BMI) is a screening tool to identify possible weight problems. It provides an estimate of body fat based on height and weight. Your health care provider can find your BMI and can help you achieve or maintain a healthy weight.For adults 20 years and older:  A BMI below 18.5 is considered underweight.  A BMI of 18.5 to 24.9 is normal.  A BMI of 25 to 29.9 is considered overweight.  A  BMI of 30 and above is considered obese.  Maintain normal blood lipids and cholesterol levels by exercising and minimizing your intake of saturated fat. Eat a balanced diet with plenty of fruit and vegetables. Blood tests for lipids and cholesterol should begin at age 45 and be repeated every 5 years. If your lipid or cholesterol levels are high, you are over 50, or you are at high risk for heart disease, you may need your cholesterol levels checked more frequently.Ongoing high lipid and cholesterol levels should be treated with medicines if diet and exercise are not working.  If you smoke, find out from your health care provider how to quit. If you do not use tobacco, do not start.  Lung cancer screening is recommended for adults aged 45-80 years who are at high risk for developing lung cancer because of a history of smoking. A yearly low-dose CT scan of the lungs is recommended for people who have at least a 30-pack-year history of smoking and are a current smoker or have quit within the past 15 years. A pack year of smoking is smoking an average of 1 pack of cigarettes a day for 1 year (for example: 1 pack a day for 30 years or 2 packs a day for 15 years). Yearly screening should continue until the smoker has stopped smoking for at least 15 years. Yearly screening should be stopped for people who develop a health problem that would prevent them from having lung cancer treatment.  If you are pregnant, do not drink alcohol. If you are  breastfeeding, be very cautious about drinking alcohol. If you are not pregnant and choose to drink alcohol, do not have more than 1 drink per day. One drink is considered to be 12 ounces (355 mL) of beer, 5 ounces (148 mL) of wine, or 1.5 ounces (44 mL) of liquor.  Avoid use of street drugs. Do not share needles with anyone. Ask for help if you need support or instructions about stopping the use of drugs.  High blood pressure causes heart disease and increases the risk  of stroke. Your blood pressure should be checked at least every 1 to 2 years. Ongoing high blood pressure should be treated with medicines if weight loss and exercise do not work.  If you are 55-79 years old, ask your health care provider if you should take aspirin to prevent strokes.  Diabetes screening is done by taking a blood sample to check your blood glucose level after you have not eaten for a certain period of time (fasting). If you are not overweight and you do not have risk factors for diabetes, you should be screened once every 3 years starting at age 45. If you are overweight or obese and you are 40-70 years of age, you should be screened for diabetes every year as part of your cardiovascular risk assessment.  Breast cancer screening is essential preventive care for women. You should practice "breast self-awareness." This means understanding the normal appearance and feel of your breasts and may include breast self-examination. Any changes detected, no matter how small, should be reported to a health care provider. Women in their 20s and 30s should have a clinical breast exam (CBE) by a health care provider as part of a regular health exam every 1 to 3 years. After age 40, women should have a CBE every year. Starting at age 40, women should consider having a mammogram (breast X-ray test) every year. Women who have a family history of breast cancer should talk to their health care provider about genetic screening. Women at a high risk of breast cancer should talk to their health care providers about having an MRI and a mammogram every year.  Breast cancer gene (BRCA)-related cancer risk assessment is recommended for women who have family members with BRCA-related cancers. BRCA-related cancers include breast, ovarian, tubal, and peritoneal cancers. Having family members with these cancers may be associated with an increased risk for harmful changes (mutations) in the breast cancer genes BRCA1 and  BRCA2. Results of the assessment will determine the need for genetic counseling and BRCA1 and BRCA2 testing.  Your health care provider may recommend that you be screened regularly for cancer of the pelvic organs (ovaries, uterus, and vagina). This screening involves a pelvic examination, including checking for microscopic changes to the surface of your cervix (Pap test). You may be encouraged to have this screening done every 3 years, beginning at age 21.  For women ages 30-65, health care providers may recommend pelvic exams and Pap testing every 3 years, or they may recommend the Pap and pelvic exam, combined with testing for human papilloma virus (HPV), every 5 years. Some types of HPV increase your risk of cervical cancer. Testing for HPV may also be done on women of any age with unclear Pap test results.  Other health care providers may not recommend any screening for nonpregnant women who are considered low risk for pelvic cancer and who do not have symptoms. Ask your health care provider if a screening pelvic exam is right for   you.  If you have had past treatment for cervical cancer or a condition that could lead to cancer, you need Pap tests and screening for cancer for at least 20 years after your treatment. If Pap tests have been discontinued, your risk factors (such as having a new sexual partner) need to be reassessed to determine if screening should resume. Some women have medical problems that increase the chance of getting cervical cancer. In these cases, your health care provider may recommend more frequent screening and Pap tests.  Colorectal cancer can be detected and often prevented. Most routine colorectal cancer screening begins at the age of 50 years and continues through age 75 years. However, your health care provider may recommend screening at an earlier age if you have risk factors for colon cancer. On a yearly basis, your health care provider may provide home test kits to check  for hidden blood in the stool. Use of a small camera at the end of a tube, to directly examine the colon (sigmoidoscopy or colonoscopy), can detect the earliest forms of colorectal cancer. Talk to your health care provider about this at age 50, when routine screening begins. Direct exam of the colon should be repeated every 5-10 years through age 75 years, unless early forms of precancerous polyps or small growths are found.  People who are at an increased risk for hepatitis B should be screened for this virus. You are considered at high risk for hepatitis B if:  You were born in a country where hepatitis B occurs often. Talk with your health care provider about which countries are considered high risk.  Your parents were born in a high-risk country and you have not received a shot to protect against hepatitis B (hepatitis B vaccine).  You have HIV or AIDS.  You use needles to inject street drugs.  You live with, or have sex with, someone who has hepatitis B.  You get hemodialysis treatment.  You take certain medicines for conditions like cancer, organ transplantation, and autoimmune conditions.  Hepatitis C blood testing is recommended for all people born from 1945 through 1965 and any individual with known risks for hepatitis C.  Practice safe sex. Use condoms and avoid high-risk sexual practices to reduce the spread of sexually transmitted infections (STIs). STIs include gonorrhea, chlamydia, syphilis, trichomonas, herpes, HPV, and human immunodeficiency virus (HIV). Herpes, HIV, and HPV are viral illnesses that have no cure. They can result in disability, cancer, and death.  You should be screened for sexually transmitted illnesses (STIs) including gonorrhea and chlamydia if:  You are sexually active and are younger than 24 years.  You are older than 24 years and your health care provider tells you that you are at risk for this type of infection.  Your sexual activity has changed  since you were last screened and you are at an increased risk for chlamydia or gonorrhea. Ask your health care provider if you are at risk.  If you are at risk of being infected with HIV, it is recommended that you take a prescription medicine daily to prevent HIV infection. This is called preexposure prophylaxis (PrEP). You are considered at risk if:  You are sexually active and do not regularly use condoms or know the HIV status of your partner(s).  You take drugs by injection.  You are sexually active with a partner who has HIV.  Talk with your health care provider about whether you are at high risk of being infected with HIV. If   you choose to begin PrEP, you should first be tested for HIV. You should then be tested every 3 months for as long as you are taking PrEP.  Osteoporosis is a disease in which the bones lose minerals and strength with aging. This can result in serious bone fractures or breaks. The risk of osteoporosis can be identified using a bone density scan. Women ages 67 years and over and women at risk for fractures or osteoporosis should discuss screening with their health care providers. Ask your health care provider whether you should take a calcium supplement or vitamin D to reduce the rate of osteoporosis.  Menopause can be associated with physical symptoms and risks. Hormone replacement therapy is available to decrease symptoms and risks. You should talk to your health care provider about whether hormone replacement therapy is right for you.  Use sunscreen. Apply sunscreen liberally and repeatedly throughout the day. You should seek shade when your shadow is shorter than you. Protect yourself by wearing long sleeves, pants, a wide-brimmed hat, and sunglasses year round, whenever you are outdoors.  Once a month, do a whole body skin exam, using a mirror to look at the skin on your back. Tell your health care provider of new moles, moles that have irregular borders, moles that  are larger than a pencil eraser, or moles that have changed in shape or color.  Stay current with required vaccines (immunizations).  Influenza vaccine. All adults should be immunized every year.  Tetanus, diphtheria, and acellular pertussis (Td, Tdap) vaccine. Pregnant women should receive 1 dose of Tdap vaccine during each pregnancy. The dose should be obtained regardless of the length of time since the last dose. Immunization is preferred during the 27th-36th week of gestation. An adult who has not previously received Tdap or who does not know her vaccine status should receive 1 dose of Tdap. This initial dose should be followed by tetanus and diphtheria toxoids (Td) booster doses every 10 years. Adults with an unknown or incomplete history of completing a 3-dose immunization series with Td-containing vaccines should begin or complete a primary immunization series including a Tdap dose. Adults should receive a Td booster every 10 years.  Varicella vaccine. An adult without evidence of immunity to varicella should receive 2 doses or a second dose if she has previously received 1 dose. Pregnant females who do not have evidence of immunity should receive the first dose after pregnancy. This first dose should be obtained before leaving the health care facility. The second dose should be obtained 4-8 weeks after the first dose.  Human papillomavirus (HPV) vaccine. Females aged 13-26 years who have not received the vaccine previously should obtain the 3-dose series. The vaccine is not recommended for use in pregnant females. However, pregnancy testing is not needed before receiving a dose. If a female is found to be pregnant after receiving a dose, no treatment is needed. In that case, the remaining doses should be delayed until after the pregnancy. Immunization is recommended for any person with an immunocompromised condition through the age of 61 years if she did not get any or all doses earlier. During the  3-dose series, the second dose should be obtained 4-8 weeks after the first dose. The third dose should be obtained 24 weeks after the first dose and 16 weeks after the second dose.  Zoster vaccine. One dose is recommended for adults aged 30 years or older unless certain conditions are present.  Measles, mumps, and rubella (MMR) vaccine. Adults born  before 1957 generally are considered immune to measles and mumps. Adults born in 1957 or later should have 1 or more doses of MMR vaccine unless there is a contraindication to the vaccine or there is laboratory evidence of immunity to each of the three diseases. A routine second dose of MMR vaccine should be obtained at least 28 days after the first dose for students attending postsecondary schools, health care workers, or international travelers. People who received inactivated measles vaccine or an unknown type of measles vaccine during 1963-1967 should receive 2 doses of MMR vaccine. People who received inactivated mumps vaccine or an unknown type of mumps vaccine before 1979 and are at high risk for mumps infection should consider immunization with 2 doses of MMR vaccine. For females of childbearing age, rubella immunity should be determined. If there is no evidence of immunity, females who are not pregnant should be vaccinated. If there is no evidence of immunity, females who are pregnant should delay immunization until after pregnancy. Unvaccinated health care workers born before 1957 who lack laboratory evidence of measles, mumps, or rubella immunity or laboratory confirmation of disease should consider measles and mumps immunization with 2 doses of MMR vaccine or rubella immunization with 1 dose of MMR vaccine.  Pneumococcal 13-valent conjugate (PCV13) vaccine. When indicated, a person who is uncertain of his immunization history and has no record of immunization should receive the PCV13 vaccine. All adults 65 years of age and older should receive this  vaccine. An adult aged 19 years or older who has certain medical conditions and has not been previously immunized should receive 1 dose of PCV13 vaccine. This PCV13 should be followed with a dose of pneumococcal polysaccharide (PPSV23) vaccine. Adults who are at high risk for pneumococcal disease should obtain the PPSV23 vaccine at least 8 weeks after the dose of PCV13 vaccine. Adults older than 65 years of age who have normal immune system function should obtain the PPSV23 vaccine dose at least 1 year after the dose of PCV13 vaccine.  Pneumococcal polysaccharide (PPSV23) vaccine. When PCV13 is also indicated, PCV13 should be obtained first. All adults aged 65 years and older should be immunized. An adult younger than age 65 years who has certain medical conditions should be immunized. Any person who resides in a nursing home or long-term care facility should be immunized. An adult smoker should be immunized. People with an immunocompromised condition and certain other conditions should receive both PCV13 and PPSV23 vaccines. People with human immunodeficiency virus (HIV) infection should be immunized as soon as possible after diagnosis. Immunization during chemotherapy or radiation therapy should be avoided. Routine use of PPSV23 vaccine is not recommended for American Indians, [place] Natives, or people younger than 65 years unless there are medical conditions that require PPSV23 vaccine. When indicated, people who have unknown immunization and have no record of immunization should receive PPSV23 vaccine. One-time revaccination 5 years after the first dose of PPSV23 is recommended for people aged 19-64 years who have chronic kidney failure, nephrotic syndrome, asplenia, or immunocompromised conditions. People who received 1-2 doses of PPSV23 before age 65 years should receive another dose of PPSV23 vaccine at age 65 years or later if at least 5 years have passed since the previous dose. Doses of PPSV23 are not  needed for people immunized with PPSV23 at or after age 65 years.  Meningococcal vaccine. Adults with asplenia or persistent complement component deficiencies should receive 2 doses of quadrivalent meningococcal conjugate (MenACWY-D) vaccine. The doses should be obtained   at least 2 months apart. Microbiologists working with certain meningococcal bacteria, [place] recruits, people at risk during an outbreak, and people who travel to or live in countries with a high rate of meningitis should be immunized. A first-year college student up through age 34 years who is living in a residence hall should receive a dose if she did not receive a dose on or after her 16th birthday. Adults who have certain high-risk conditions should receive one or more doses of vaccine.  Hepatitis A vaccine. Adults who wish to be protected from this disease, have certain high-risk conditions, work with hepatitis A-infected animals, work in hepatitis A research labs, or travel to or work in countries with a high rate of hepatitis A should be immunized. Adults who were previously unvaccinated and who anticipate close contact with an international adoptee during the first 60 days after arrival in the [place] from a country with a high rate of hepatitis A should be immunized.  Hepatitis B vaccine. Adults who wish to be protected from this disease, have certain high-risk conditions, may be exposed to blood or other infectious body fluids, are household contacts or sex partners of hepatitis B positive people, are clients or workers in certain care facilities, or travel to or work in countries with a high rate of hepatitis B should be immunized.  Haemophilus influenzae type b (Hib) vaccine. A previously unvaccinated person with asplenia or sickle cell disease or having a scheduled splenectomy should receive 1 dose of Hib vaccine. Regardless of previous immunization, a recipient of a hematopoietic stem cell transplant should receive a  3-dose series 6-12 months after her successful transplant. Hib vaccine is not recommended for adults with HIV infection. Preventive Services / Frequency Ages 35 to 4 years  Blood pressure check.** / Every 3-5 years.  Lipid and cholesterol check.** / Every 5 years beginning at age 60.  Clinical breast exam.** / Every 3 years for women in their 71s and 10s.  BRCA-related cancer risk assessment.** / For women who have family members with a BRCA-related cancer (breast, ovarian, tubal, or peritoneal cancers).  Pap test.** / Every 2 years from ages 76 through 26. Every 3 years starting at age 61 through age 76 or 93 with a history of 3 consecutive normal Pap tests.  HPV screening.** / Every 3 years from ages 37 through ages 60 to 51 with a history of 3 consecutive normal Pap tests.  Hepatitis C blood test.** / For any individual with known risks for hepatitis C.  Skin self-exam. / Monthly.  Influenza vaccine. / Every year.  Tetanus, diphtheria, and acellular pertussis (Tdap, Td) vaccine.** / Consult your health care provider. Pregnant women should receive 1 dose of Tdap vaccine during each pregnancy. 1 dose of Td every 10 years.  Varicella vaccine.** / Consult your health care provider. Pregnant females who do not have evidence of immunity should receive the first dose after pregnancy.  HPV vaccine. / 3 doses over 6 months, if 93 and younger. The vaccine is not recommended for use in pregnant females. However, pregnancy testing is not needed before receiving a dose.  Measles, mumps, rubella (MMR) vaccine.** / You need at least 1 dose of MMR if you were born in 1957 or later. You may also need a 2nd dose. For females of childbearing age, rubella immunity should be determined. If there is no evidence of immunity, females who are not pregnant should be vaccinated. If there is no evidence of immunity, females who are  pregnant should delay immunization until after pregnancy.  Pneumococcal  13-valent conjugate (PCV13) vaccine.** / Consult your health care provider.  Pneumococcal polysaccharide (PPSV23) vaccine.** / 1 to 2 doses if you smoke cigarettes or if you have certain conditions.  Meningococcal vaccine.** / 1 dose if you are age 68 to 8 years and a Market researcher living in a residence hall, or have one of several medical conditions, you need to get vaccinated against meningococcal disease. You may also need additional booster doses.  Hepatitis A vaccine.** / Consult your health care provider.  Hepatitis B vaccine.** / Consult your health care provider.  Haemophilus influenzae type b (Hib) vaccine.** / Consult your health care provider. Ages 7 to 53 years  Blood pressure check.** / Every year.  Lipid and cholesterol check.** / Every 5 years beginning at age 25 years.  Lung cancer screening. / Every year if you are aged 11-80 years and have a 30-pack-year history of smoking and currently smoke or have quit within the past 15 years. Yearly screening is stopped once you have quit smoking for at least 15 years or develop a health problem that would prevent you from having lung cancer treatment.  Clinical breast exam.** / Every year after age 48 years.  BRCA-related cancer risk assessment.** / For women who have family members with a BRCA-related cancer (breast, ovarian, tubal, or peritoneal cancers).  Mammogram.** / Every year beginning at age 41 years and continuing for as long as you are in good health. Consult with your health care provider.  Pap test.** / Every 3 years starting at age 65 years through age 37 or 70 years with a history of 3 consecutive normal Pap tests.  HPV screening.** / Every 3 years from ages 72 years through ages 60 to 40 years with a history of 3 consecutive normal Pap tests.  Fecal occult blood test (FOBT) of stool. / Every year beginning at age 21 years and continuing until age 5 years. You may not need to do this test if you get  a colonoscopy every 10 years.  Flexible sigmoidoscopy or colonoscopy.** / Every 5 years for a flexible sigmoidoscopy or every 10 years for a colonoscopy beginning at age 35 years and continuing until age 48 years.  Hepatitis C blood test.** / For all people born from [birth year] through 1965 and any individual with known risks for hepatitis C.  Skin self-exam. / Monthly.  Influenza vaccine. / Every year.  Tetanus, diphtheria, and acellular pertussis (Tdap/Td) vaccine.** / Consult your health care provider. Pregnant women should receive 1 dose of Tdap vaccine during each pregnancy. 1 dose of Td every 10 years.  Varicella vaccine.** / Consult your health care provider. Pregnant females who do not have evidence of immunity should receive the first dose after pregnancy.  Zoster vaccine.** / 1 dose for adults aged 30 years or older.  Measles, mumps, rubella (MMR) vaccine.** / You need at least 1 dose of MMR if you were born in 1957 or later. You may also need a second dose. For females of childbearing age, rubella immunity should be determined. If there is no evidence of immunity, females who are not pregnant should be vaccinated. If there is no evidence of immunity, females who are pregnant should delay immunization until after pregnancy.  Pneumococcal 13-valent conjugate (PCV13) vaccine.** / Consult your health care provider.  Pneumococcal polysaccharide (PPSV23) vaccine.** / 1 to 2 doses if you smoke cigarettes or if you have certain conditions.  Meningococcal vaccine.** /  Consult your health care provider.  Hepatitis A vaccine.** / Consult your health care provider.  Hepatitis B vaccine.** / Consult your health care provider.  Haemophilus influenzae type b (Hib) vaccine.** / Consult your health care provider. Ages 64 years and over  Blood pressure check.** / Every year.  Lipid and cholesterol check.** / Every 5 years beginning at age 23 years.  Lung cancer screening. / Every year if you  are aged 16-80 years and have a 30-pack-year history of smoking and currently smoke or have quit within the past 15 years. Yearly screening is stopped once you have quit smoking for at least 15 years or develop a health problem that would prevent you from having lung cancer treatment.  Clinical breast exam.** / Every year after age 74 years.  BRCA-related cancer risk assessment.** / For women who have family members with a BRCA-related cancer (breast, ovarian, tubal, or peritoneal cancers).  Mammogram.** / Every year beginning at age 44 years and continuing for as long as you are in good health. Consult with your health care provider.  Pap test.** / Every 3 years starting at age 58 years through age 22 or 39 years with 3 consecutive normal Pap tests. Testing can be stopped between 65 and 70 years with 3 consecutive normal Pap tests and no abnormal Pap or HPV tests in the past 10 years.  HPV screening.** / Every 3 years from ages 64 years through ages 70 or 61 years with a history of 3 consecutive normal Pap tests. Testing can be stopped between 65 and 70 years with 3 consecutive normal Pap tests and no abnormal Pap or HPV tests in the past 10 years.  Fecal occult blood test (FOBT) of stool. / Every year beginning at age 40 years and continuing until age 27 years. You may not need to do this test if you get a colonoscopy every 10 years.  Flexible sigmoidoscopy or colonoscopy.** / Every 5 years for a flexible sigmoidoscopy or every 10 years for a colonoscopy beginning at age 7 years and continuing until age 32 years.  Hepatitis C blood test.** / For all people born from [birth year] through 1966 and any individual with known risks for hepatitis C.  Osteoporosis screening.** / A one-time screening for women ages 30 years and over and women at risk for fractures or osteoporosis.  Skin self-exam. / Monthly.  Influenza vaccine. / Every year.  Tetanus, diphtheria, and acellular pertussis (Tdap/Td)  vaccine.** / 1 dose of Td every 10 years.  Varicella vaccine.** / Consult your health care provider.  Zoster vaccine.** / 1 dose for adults aged 35 years or older.  Pneumococcal 13-valent conjugate (PCV13) vaccine.** / Consult your health care provider.  Pneumococcal polysaccharide (PPSV23) vaccine.** / 1 dose for all adults aged 46 years and older.  Meningococcal vaccine.** / Consult your health care provider.  Hepatitis A vaccine.** / Consult your health care provider.  Hepatitis B vaccine.** / Consult your health care provider.  Haemophilus influenzae type b (Hib) vaccine.** / Consult your health care provider. ** Family history and personal history of risk and conditions may change your health care provider's recommendations.   This information is not intended to replace advice given to you by your health care provider. Make sure you discuss any questions you have with your health care provider.   Document Released: 10/21/2001 Document Revised: 09/15/2014 Document Reviewed: 01/20/2011 Elsevier Interactive Patient Education [employer].

## 2016-02-28 NOTE — Progress Notes (Signed)
Pre visit review using our clinic review tool, if applicable. No additional management support is needed unless otherwise documented below in the visit note. 

## 2016-02-28 NOTE — Assessment & Plan Note (Signed)
Here for medicare wellness/physical  Diet: heart healthy  Physical activity: not sedentary  Depression/mood screen: negative  Hearing: intact to whispered voice  Visual acuity: grossly normal, performs annual eye exam  ADLs: capable  Fall risk: low to none  Home safety: good  Cognitive evaluation: intact to orientation, naming, recall and repetition  EOL planning: adv directives, full code/ I agree  I have personally reviewed and have noted  1. The patient's medical, surgical and social history  2. Their use of alcohol, tobacco or illicit drugs  3. Their current medications and supplements  4. The patient's functional ability including ADL's, fall risks, home safety risks and hearing or visual impairment.  5. Diet and physical activities  6. Evidence for depression or mood disorders 7. The roster of all physicians providing medical care to patient - is listed in the Snapshot section of the chart and reviewed today.    Today patient counseled on age appropriate routine health concerns for screening and prevention, each reviewed and up to date or declined. Immunizations reviewed and up to date or declined. Labs ordered and reviewed. Risk factors for depression reviewed and negative. Hearing function and visual acuity are intact. ADLs screened and addressed as needed. Functional ability and level of safety reviewed and appropriate. Education, counseling and referrals performed based on assessed risks today. Patient provided with a copy of personalized plan for preventive services.  GYN - Dr Chestine Sporelark, Mammo q 12 mo  Colon due in 2020 Dr Marina GoodellPerry

## 2016-02-29 LAB — HEPATITIS C ANTIBODY: HCV Ab: NEGATIVE

## 2016-03-29 LAB — COLOGUARD: COLOGUARD: NEGATIVE

## 2016-04-22 ENCOUNTER — Encounter: Payer: Self-pay | Admitting: Internal Medicine

## 2016-04-25 ENCOUNTER — Encounter: Payer: Self-pay | Admitting: Internal Medicine

## 2016-05-05 ENCOUNTER — Other Ambulatory Visit: Payer: Self-pay | Admitting: Internal Medicine

## 2016-05-05 DIAGNOSIS — Z1231 Encounter for screening mammogram for malignant neoplasm of breast: Secondary | ICD-10-CM

## 2016-06-16 ENCOUNTER — Ambulatory Visit
Admission: RE | Admit: 2016-06-16 | Discharge: 2016-06-16 | Disposition: A | Discharge status: Home / Self Care | Payer: Medicare Other | Source: Ambulatory Visit | Attending: Internal Medicine | Admitting: Internal Medicine

## 2016-06-16 DIAGNOSIS — Z1231 Encounter for screening mammogram for malignant neoplasm of breast: Secondary | ICD-10-CM

## 2016-06-19 ENCOUNTER — Ambulatory Visit (INDEPENDENT_AMBULATORY_CARE_PROVIDER_SITE_OTHER): Payer: Medicare Other

## 2016-06-19 DIAGNOSIS — Z23 Encounter for immunization: Secondary | ICD-10-CM

## 2017-02-17 ENCOUNTER — Other Ambulatory Visit (INDEPENDENT_AMBULATORY_CARE_PROVIDER_SITE_OTHER): Payer: Medicare Other

## 2017-02-17 ENCOUNTER — Encounter: Payer: Self-pay | Admitting: Internal Medicine

## 2017-02-17 ENCOUNTER — Ambulatory Visit (INDEPENDENT_AMBULATORY_CARE_PROVIDER_SITE_OTHER): Payer: Medicare Other | Admitting: Internal Medicine

## 2017-02-17 VITALS — BP 146/82 | HR 85 | Temp 98.1°F | Ht 66.0 in | Wt 152.0 lb

## 2017-02-17 DIAGNOSIS — Z0001 Encounter for general adult medical examination with abnormal findings: Secondary | ICD-10-CM | POA: Diagnosis not present

## 2017-02-17 DIAGNOSIS — Z0000 Encounter for general adult medical examination without abnormal findings: Secondary | ICD-10-CM

## 2017-02-17 DIAGNOSIS — I10 Essential (primary) hypertension: Secondary | ICD-10-CM | POA: Diagnosis not present

## 2017-02-17 DIAGNOSIS — E785 Hyperlipidemia, unspecified: Secondary | ICD-10-CM | POA: Diagnosis not present

## 2017-02-17 LAB — BASIC METABOLIC PANEL
BUN: 13 mg/dL (ref 6–23)
CALCIUM: 9.6 mg/dL (ref 8.4–10.5)
CO2: 29 mEq/L (ref 19–32)
CREATININE: 0.7 mg/dL (ref 0.40–1.20)
Chloride: 106 mEq/L (ref 96–112)
GFR: 88.73 mL/min (ref >60.00)
Glucose, Bld: 98 mg/dL (ref 70–99)
Potassium: 3.8 mEq/L (ref 3.5–5.1)
Sodium: 141 mEq/L (ref 135–145)

## 2017-02-17 LAB — CBC WITH DIFFERENTIAL/PLATELET
BASOS PCT: 0.7 % (ref 0.0–3.0)
Basophils Absolute: 0.1 10*3/uL (ref 0.0–0.1)
EOS PCT: 0.6 % (ref 0.0–5.0)
Eosinophils Absolute: 0 10*3/uL (ref 0.0–0.7)
HCT: 42.4 % (ref 36.0–46.0)
Hemoglobin: 14.4 g/dL (ref 12.0–15.0)
LYMPHS ABS: 1.4 10*3/uL (ref 0.7–4.0)
Lymphocytes Relative: 19.5 % (ref 12.0–46.0)
MCHC: 34 g/dL (ref 30.0–36.0)
MCV: 86.5 fl (ref 78.0–100.0)
Monocytes Absolute: 0.4 10*3/uL (ref 0.1–1.0)
Monocytes Relative: 5.2 % (ref 3.0–12.0)
NEUTROS ABS: 5.3 10*3/uL (ref 1.4–7.7)
NEUTROS PCT: 74 % (ref 43.0–77.0)
PLATELETS: 323 10*3/uL (ref 150.0–400.0)
RBC: 4.91 Mil/uL (ref 3.87–5.11)
RDW: 13.7 % (ref 11.5–15.5)
WBC: 7.1 10*3/uL (ref 4.0–10.5)

## 2017-02-17 LAB — LIPID PANEL
CHOL/HDL RATIO: 3
CHOLESTEROL: 197 mg/dL (ref 0–200)
HDL: 64 mg/dL (ref >39.00)
LDL CALC: 115 mg/dL — AB (ref 0–99)
NonHDL: 133.09
TRIGLYCERIDES: 92 mg/dL (ref 0.0–149.0)
VLDL: 18.4 mg/dL (ref 0.0–40.0)

## 2017-02-17 LAB — HEPATIC FUNCTION PANEL
ALT: 13 U/L (ref 0–35)
AST: 15 U/L (ref 0–37)
Albumin: 4.5 g/dL (ref 3.5–5.2)
Alkaline Phosphatase: 45 U/L (ref 39–117)
BILIRUBIN DIRECT: 0.1 mg/dL (ref 0.0–0.3)
BILIRUBIN TOTAL: 0.6 mg/dL (ref 0.2–1.2)
TOTAL PROTEIN: 7.6 g/dL (ref 6.0–8.3)

## 2017-02-17 LAB — URINALYSIS
Bilirubin Urine: NEGATIVE
Hgb urine dipstick: NEGATIVE
Ketones, ur: NEGATIVE
Leukocytes, UA: NEGATIVE
NITRITE: NEGATIVE
SPECIFIC GRAVITY, URINE: 1.01 (ref 1.000–1.030)
Total Protein, Urine: NEGATIVE
UROBILINOGEN UA: 0.2 (ref 0.0–1.0)
Urine Glucose: NEGATIVE
pH: 7 (ref 5.0–8.0)

## 2017-02-17 LAB — TSH: TSH: 1.19 u[IU]/mL (ref 0.35–4.50)

## 2017-02-17 MED ORDER — LOSARTAN POTASSIUM 50 MG PO TABS: 50.0000 mg | ORAL_TABLET | Freq: Every day | ORAL | 3 refills | Status: DC

## 2017-02-17 MED ORDER — RANITIDINE HCL 150 MG PO TABS: 150.0000 mg | ORAL_TABLET | Freq: Two times a day (BID) | ORAL | 3 refills | Status: DC

## 2017-02-17 MED ORDER — ATORVASTATIN CALCIUM 10 MG PO TABS: 10.0000 mg | ORAL_TABLET | Freq: Every day | ORAL | 3 refills | Status: DC

## 2017-02-17 NOTE — Patient Instructions (Signed)
Health Maintenance for Postmenopausal Women Menopause is a normal process in which your reproductive ability comes to an end. This process happens gradually over a span of months to years, usually between the ages of 22 and 9. Menopause is complete when you have missed 12 consecutive menstrual periods. It is important to talk with your health care provider about some of the most common conditions that affect postmenopausal women, such as heart disease, cancer, and bone loss (osteoporosis). Adopting a healthy lifestyle and getting preventive care can help to promote your health and wellness. Those actions can also lower your chances of developing some of these common conditions. What should I know about menopause? During menopause, you may experience a number of symptoms, such as:  Moderate-to-severe hot flashes.  Night sweats.  Decrease in sex drive.  Mood swings.  Headaches.  Tiredness.  Irritability.  Memory problems.  Insomnia.  Choosing to treat or not to treat menopausal changes is an individual decision that you make with your health care provider. What should I know about hormone replacement therapy and supplements? Hormone therapy products are effective for treating symptoms that are associated with menopause, such as hot flashes and night sweats. Hormone replacement carries certain risks, especially as you become older. If you are thinking about using estrogen or estrogen with progestin treatments, discuss the benefits and risks with your health care provider. What should I know about heart disease and stroke? Heart disease, heart attack, and stroke become more likely as you age. This may be due, in part, to the hormonal changes that your body experiences during menopause. These can affect how your body processes dietary fats, triglycerides, and cholesterol. Heart attack and stroke are both medical emergencies. There are many things that you can do to help prevent heart disease  and stroke:  Have your blood pressure checked at least every 1-2 years. High blood pressure causes heart disease and increases the risk of stroke.  If you are 53-22 years old, ask your health care provider if you should take aspirin to prevent a heart attack or a stroke.  Do not use any tobacco products, including cigarettes, chewing tobacco, or electronic cigarettes. If you need help quitting, ask your health care provider.  It is important to eat a healthy diet and maintain a healthy weight. ? Be sure to include plenty of vegetables, fruits, low-fat dairy products, and lean protein. ? Avoid eating foods that are high in solid fats, added sugars, or salt (sodium).  Get regular exercise. This is one of the most important things that you can do for your health. ? Try to exercise for at least 150 minutes each week. The type of exercise that you do should increase your heart rate and make you sweat. This is known as moderate-intensity exercise. ? Try to do strengthening exercises at least twice each week. Do these in addition to the moderate-intensity exercise.  Know your numbers.Ask your health care provider to check your cholesterol and your blood glucose. Continue to have your blood tested as directed by your health care provider.  What should I know about cancer screening? There are several types of cancer. Take the following steps to reduce your risk and to catch any cancer development as early as possible. Breast Cancer  Practice breast self-awareness. ? This means understanding how your breasts normally appear and feel. ? It also means doing regular breast self-exams. Let your health care provider know about any changes, no matter how small.  If you are 40  or older, have a clinician do a breast exam (clinical breast exam or CBE) every year. Depending on your age, family history, and medical history, it may be recommended that you also have a yearly breast X-ray (mammogram).  If you  have a family history of breast cancer, talk with your health care provider about genetic screening.  If you are at high risk for breast cancer, talk with your health care provider about having an MRI and a mammogram every year.  Breast cancer (BRCA) gene test is recommended for women who have family members with BRCA-related cancers. Results of the assessment will determine the need for genetic counseling and BRCA1 and for BRCA2 testing. BRCA-related cancers include these types: ? Breast. This occurs in males or females. ? Ovarian. ? Tubal. This may also be called fallopian tube cancer. ? Cancer of the abdominal or pelvic lining (peritoneal cancer). ? Prostate. ? Pancreatic.  Cervical, Uterine, and Ovarian Cancer Your health care provider may recommend that you be screened regularly for cancer of the pelvic organs. These include your ovaries, uterus, and vagina. This screening involves a pelvic exam, which includes checking for microscopic changes to the surface of your cervix (Pap test).  For women ages 21-65, health care providers may recommend a pelvic exam and a Pap test every three years. For women ages 79-65, they may recommend the Pap test and pelvic exam, combined with testing for human papilloma virus (HPV), every five years. Some types of HPV increase your risk of cervical cancer. Testing for HPV may also be done on women of any age who have unclear Pap test results.  Other health care providers may not recommend any screening for nonpregnant women who are considered low risk for pelvic cancer and have no symptoms. Ask your health care provider if a screening pelvic exam is right for you.  If you have had past treatment for cervical cancer or a condition that could lead to cancer, you need Pap tests and screening for cancer for at least 20 years after your treatment. If Pap tests have been discontinued for you, your risk factors (such as having a new sexual partner) need to be  reassessed to determine if you should start having screenings again. Some women have medical problems that increase the chance of getting cervical cancer. In these cases, your health care provider may recommend that you have screening and Pap tests more often.  If you have a family history of uterine cancer or ovarian cancer, talk with your health care provider about genetic screening.  If you have vaginal bleeding after reaching menopause, tell your health care provider.  There are currently no reliable tests available to screen for ovarian cancer.  Lung Cancer Lung cancer screening is recommended for adults 69-62 years old who are at high risk for lung cancer because of a history of smoking. A yearly low-dose CT scan of the lungs is recommended if you:  Currently smoke.  Have a history of at least 30 pack-years of smoking and you currently smoke or have quit within the past 15 years. A pack-year is smoking an average of one pack of cigarettes per day for one year.  Yearly screening should:  Continue until it has been 15 years since you quit.  Stop if you develop a health problem that would prevent you from having lung cancer treatment.  Colorectal Cancer  This type of cancer can be detected and can often be prevented.  Routine colorectal cancer screening usually begins at  age 42 and continues through age 45.  If you have risk factors for colon cancer, your health care provider may recommend that you be screened at an earlier age.  If you have a family history of colorectal cancer, talk with your health care provider about genetic screening.  Your health care provider may also recommend using home test kits to check for hidden blood in your stool.  A small camera at the end of a tube can be used to examine your colon directly (sigmoidoscopy or colonoscopy). This is done to check for the earliest forms of colorectal cancer.  Direct examination of the colon should be repeated every  5-10 years until age 71. However, if early forms of precancerous polyps or small growths are found or if you have a family history or genetic risk for colorectal cancer, you may need to be screened more often.  Skin Cancer  Check your skin from head to toe regularly.  Monitor any moles. Be sure to tell your health care provider: ? About any new moles or changes in moles, especially if there is a change in a mole's shape or color. ? If you have a mole that is larger than the size of a pencil eraser.  If any of your family members has a history of skin cancer, especially at a young age, talk with your health care provider about genetic screening.  Always use sunscreen. Apply sunscreen liberally and repeatedly throughout the day.  Whenever you are outside, protect yourself by wearing long sleeves, pants, a wide-brimmed hat, and sunglasses.  What should I know about osteoporosis? Osteoporosis is a condition in which bone destruction happens more quickly than new bone creation. After menopause, you may be at an increased risk for osteoporosis. To help prevent osteoporosis or the bone fractures that can happen because of osteoporosis, the following is recommended:  If you are 46-71 years old, get at least 1,000 mg of calcium and at least 600 mg of vitamin D per day.  If you are older than age 55 but younger than age 65, get at least 1,200 mg of calcium and at least 600 mg of vitamin D per day.  If you are older than age 54, get at least 1,200 mg of calcium and at least 800 mg of vitamin D per day.  Smoking and excessive alcohol intake increase the risk of osteoporosis. Eat foods that are rich in calcium and vitamin D, and do weight-bearing exercises several times each week as directed by your health care provider. What should I know about how menopause affects my mental health? Depression may occur at any age, but it is more common as you become older. Common symptoms of depression  include:  Low or sad mood.  Changes in sleep patterns.  Changes in appetite or eating patterns.  Feeling an overall lack of motivation or enjoyment of activities that you previously enjoyed.  Frequent crying spells.  Talk with your health care provider if you think that you are experiencing depression. What should I know about immunizations? It is important that you get and maintain your immunizations. These include:  Tetanus, diphtheria, and pertussis (Tdap) booster vaccine.  Influenza every year before the flu season begins.  Pneumonia vaccine.  Shingles vaccine.  Your health care provider may also recommend other immunizations. This information is not intended to replace advice given to you by your health care provider. Make sure you discuss any questions you have with your health care provider. Document Released: 10/17/2005  Document Revised: 03/14/2016 Document Reviewed: 05/29/2015 Elsevier Interactive Patient Education  [employer].

## 2017-02-17 NOTE — Assessment & Plan Note (Signed)
Lipitor Labs 

## 2017-02-17 NOTE — Assessment & Plan Note (Signed)
Start Losartan 

## 2017-02-17 NOTE — Progress Notes (Signed)
Subjective:  Patient ID: [name] [name], female    DOB: [birth year]  Age: 66 y.o. MRN: [id number]  CC: No chief complaint on file.   HPI Marcella Charlson Hosie presents for a well exam. C/o SBP>140  Outpatient Medications Prior to Visit  Medication Sig Dispense Refill  . Albuterol Sulfate (PROAIR RESPICLICK) 108 (90 Base) MCG/ACT AEPB Inhale 2 puffs into the lungs as needed.    Marland Kitchen atorvastatin (LIPITOR) 10 MG tablet Take 1 tablet (10 mg total) by mouth daily. 90 tablet 3  . BREO ELLIPTA 200-25 MCG/INH AEPB Inhale 1 puff into the lungs daily.  6  . cholecalciferol (VITAMIN D) 1000 UNITS tablet Take 1,000 Units by mouth daily.      Marland Kitchen loratadine (CLARITIN) 10 MG tablet Take 10 mg by mouth daily.      Marland Kitchen losartan (COZAAR) 50 MG tablet Take 1 tablet (50 mg total) by mouth daily. 30 tablet 11  . mometasone (NASONEX) 50 MCG/ACT nasal spray Place 2 sprays into the nose as directed.      . ranitidine (ZANTAC) 150 MG tablet Take 1 tablet (150 mg total) by mouth 2 (two) times daily. 180 tablet 3  . Wheat Dextrin (BENEFIBER) POWD Take by mouth. 2 tsp daily.     . butalbital-acetaminophen-caffeine (FIORICET) 50-325-40 MG per tablet Take 1-2 tablets by mouth 2 (two) times daily as needed for headache. 90 tablet 0  . sennosides-docusate sodium (SENOKOT-S) 8.6-50 MG tablet Take 1 tablet by mouth 2 (two) times daily.       No facility-administered medications prior to visit.     ROS Review of Systems  Constitutional: Negative for activity change, appetite change, chills, fatigue and unexpected weight change.  HENT: Negative for congestion, mouth sores and sinus pressure.   Eyes: Negative for visual disturbance.  Respiratory: Negative for cough and chest tightness.   Gastrointestinal: Negative for abdominal pain and nausea.  Genitourinary: Negative for difficulty urinating, frequency and vaginal pain.  Musculoskeletal: Negative for back pain and gait problem.  Skin: Negative for pallor and rash.    Neurological: Negative for dizziness, tremors, weakness, numbness and headaches.  Psychiatric/Behavioral: Negative for confusion, sleep disturbance and suicidal ideas.    Objective:  BP (!) 146/82 (BP Location: Left Arm, Patient Position: Sitting, Cuff Size: Normal)   Pulse 85   Temp 98.1 F (36.7 C) (Oral)   Ht 5\' 6"  (1.676 m)   Wt 152 lb (68.9 kg)   SpO2 99%   BMI 24.53 kg/m   BP Readings from Last 3 Encounters:  02/17/17 (!) 146/82  02/28/16 129/80  02/20/15 (!) 142/88    Wt Readings from Last 3 Encounters:  02/17/17 152 lb (68.9 kg)  02/28/16 152 lb (68.9 kg)  02/20/15 150 lb 12 oz (68.4 kg)    Physical Exam  Constitutional: She appears well-developed. No distress.  HENT:  Head: Normocephalic.  Right Ear: External ear normal.  Left Ear: External ear normal.  Nose: Nose normal.  Mouth/Throat: Oropharynx is clear and moist.  Eyes: Conjunctivae are normal. Pupils are equal, round, and reactive to light. Right eye exhibits no discharge. Left eye exhibits no discharge.  Neck: Normal range of motion. Neck supple. No JVD present. No tracheal deviation present. No thyromegaly present.  Cardiovascular: Normal rate, regular rhythm and normal heart sounds.   Pulmonary/Chest: No stridor. No respiratory distress. She has no wheezes.  Abdominal: Soft. Bowel sounds are normal. She exhibits no distension and no mass. There is no tenderness. There is no  rebound and no guarding.  Musculoskeletal: She exhibits no edema or tenderness.  Lymphadenopathy:    She has no cervical adenopathy.  Neurological: She displays normal reflexes. No cranial nerve deficit. She exhibits normal muscle tone. Coordination normal.  Skin: No rash noted. No erythema.  Psychiatric: She has a normal mood and affect. Her behavior is normal. Judgment and thought content normal.    Lab Results  Component Value Date   WBC 6.3 02/28/2016   HGB 14.4 02/28/2016   HCT 43.3 02/28/2016   PLT 322.0 02/28/2016    GLUCOSE 98 02/28/2016   CHOL 188 02/28/2016   TRIG 101.0 02/28/2016   HDL 60.00 02/28/2016   LDLCALC 108 (H) 02/28/2016   ALT 13 02/28/2016   AST 16 02/28/2016   NA 141 02/28/2016   K 4.5 02/28/2016   CL 105 02/28/2016   CREATININE 0.69 02/28/2016   BUN 10 02/28/2016   CO2 30 02/28/2016   TSH 1.36 02/28/2016    Mm Digital Screening Bilateral  Result Date: 06/16/2016 CLINICAL DATA:  Screening. EXAM: DIGITAL SCREENING BILATERAL MAMMOGRAM WITH CAD COMPARISON:  Previous exam(s). ACR Breast Density Category b: There are scattered areas of fibroglandular density. FINDINGS: There are no findings suspicious for malignancy. Images were processed with CAD. IMPRESSION: No mammographic evidence of malignancy. A result letter of this screening mammogram will be mailed directly to the patient. RECOMMENDATION: Screening mammogram in one year. (Code:SM-B-01Y) BI-RADS CATEGORY  1: Negative. Electronically Signed   By: Elberta Fortisaniel  Boyle M.D.   On: 06/18/2016 13:54    Assessment & Plan:   There are no diagnoses linked to this encounter. I have discontinued Ms. Kinnett's sennosides-docusate sodium and butalbital-acetaminophen-caffeine. I am also having her maintain her BENEFIBER, loratadine, mometasone, cholecalciferol, losartan, BREO ELLIPTA, Albuterol Sulfate, atorvastatin, and ranitidine.  No orders of the defined types were placed in this encounter.    Follow-up: No Follow-up on file.  Sonda PrimesAlex Taiwan Millon, MD

## 2017-02-17 NOTE — Assessment & Plan Note (Signed)
Here for medicare wellness/physical  Diet: heart healthy  Physical activity: not sedentary  Depression/mood screen: negative  Hearing: intact to whispered voice  Visual acuity: grossly normal w/glasses, performs annual eye exam  ADLs: capable  Fall risk: low to none  Home safety: good  Cognitive evaluation: intact to orientation, naming, recall and repetition  EOL planning: adv directives, full code/ I agree  I have personally reviewed and have noted  1. The patient's medical, surgical and social history  2. Their use of alcohol, tobacco or illicit drugs  3. Their current medications and supplements  4. The patient's functional ability including ADL's, fall risks, home safety risks and hearing or visual impairment.  5. Diet and physical activities  6. Evidence for depression or mood disorders 7. The roster of all physicians providing medical care to patient - is listed in the Snapshot section of the chart and reviewed today.    Today patient counseled on age appropriate routine health concerns for screening and prevention, each reviewed and up to date or declined. Immunizations reviewed and up to date or declined. Labs ordered and reviewed. Risk factors for depression reviewed and negative. Hearing function and visual acuity are intact. ADLs screened and addressed as needed. Functional ability and level of safety reviewed and appropriate. Education, counseling and referrals performed based on assessed risks today. Patient provided with a copy of personalized plan for preventive services.   GYN - Dr Chestine Sporelark, Mammo q 12 mo Colon due in 2020 Dr Marina GoodellPerry

## 2017-03-02 ENCOUNTER — Encounter: Payer: Medicare Other | Admitting: Internal Medicine

## 2017-05-05 ENCOUNTER — Other Ambulatory Visit: Payer: Self-pay | Admitting: Internal Medicine

## 2017-05-05 DIAGNOSIS — Z1231 Encounter for screening mammogram for malignant neoplasm of breast: Secondary | ICD-10-CM

## 2017-06-19 ENCOUNTER — Ambulatory Visit: Payer: Medicare Other

## 2017-06-23 ENCOUNTER — Ambulatory Visit (INDEPENDENT_AMBULATORY_CARE_PROVIDER_SITE_OTHER): Payer: Medicare Other | Admitting: General Practice

## 2017-06-23 DIAGNOSIS — Z23 Encounter for immunization: Secondary | ICD-10-CM | POA: Diagnosis not present

## 2017-07-10 ENCOUNTER — Ambulatory Visit
Admission: RE | Admit: 2017-07-10 | Discharge: 2017-07-10 | Disposition: A | Discharge status: Home / Self Care | Payer: Medicare Other | Source: Ambulatory Visit | Attending: Internal Medicine | Admitting: Internal Medicine

## 2017-07-10 DIAGNOSIS — Z1231 Encounter for screening mammogram for malignant neoplasm of breast: Secondary | ICD-10-CM

## 2017-11-16 DIAGNOSIS — E7800 Pure hypercholesterolemia, unspecified: Secondary | ICD-10-CM | POA: Insufficient documentation

## 2017-11-16 DIAGNOSIS — M8580 Other specified disorders of bone density and structure, unspecified site: Secondary | ICD-10-CM | POA: Insufficient documentation

## 2018-02-23 ENCOUNTER — Other Ambulatory Visit (INDEPENDENT_AMBULATORY_CARE_PROVIDER_SITE_OTHER): Payer: Medicare Other

## 2018-02-23 ENCOUNTER — Ambulatory Visit (INDEPENDENT_AMBULATORY_CARE_PROVIDER_SITE_OTHER)
Admission: RE | Admit: 2018-02-23 | Discharge: 2018-02-23 | Disposition: A | Discharge status: Home / Self Care | Payer: Medicare Other | Source: Ambulatory Visit | Attending: Internal Medicine | Admitting: Internal Medicine

## 2018-02-23 ENCOUNTER — Ambulatory Visit (INDEPENDENT_AMBULATORY_CARE_PROVIDER_SITE_OTHER): Payer: Medicare Other | Admitting: Internal Medicine

## 2018-02-23 ENCOUNTER — Encounter: Payer: Self-pay | Admitting: Internal Medicine

## 2018-02-23 VITALS — BP 132/78 | HR 70 | Temp 98.4°F | Ht 66.0 in | Wt 156.0 lb

## 2018-02-23 DIAGNOSIS — E785 Hyperlipidemia, unspecified: Secondary | ICD-10-CM

## 2018-02-23 DIAGNOSIS — Z780 Asymptomatic menopausal state: Secondary | ICD-10-CM | POA: Diagnosis not present

## 2018-02-23 DIAGNOSIS — Z0000 Encounter for general adult medical examination without abnormal findings: Secondary | ICD-10-CM

## 2018-02-23 DIAGNOSIS — I10 Essential (primary) hypertension: Secondary | ICD-10-CM | POA: Diagnosis not present

## 2018-02-23 LAB — CBC WITH DIFFERENTIAL/PLATELET
BASOS ABS: 0.1 10*3/uL (ref 0.0–0.1)
Basophils Relative: 1.3 % (ref 0.0–3.0)
EOS ABS: 0 10*3/uL (ref 0.0–0.7)
Eosinophils Relative: 0.9 % (ref 0.0–5.0)
HEMATOCRIT: 40.8 % (ref 36.0–46.0)
HEMOGLOBIN: 14 g/dL (ref 12.0–15.0)
LYMPHS PCT: 26.9 % (ref 12.0–46.0)
Lymphs Abs: 1.5 10*3/uL (ref 0.7–4.0)
MCHC: 34.4 g/dL (ref 30.0–36.0)
MCV: 86.8 fl (ref 78.0–100.0)
Monocytes Absolute: 0.4 10*3/uL (ref 0.1–1.0)
Monocytes Relative: 7.2 % (ref 3.0–12.0)
Neutro Abs: 3.6 10*3/uL (ref 1.4–7.7)
Neutrophils Relative %: 63.7 % (ref 43.0–77.0)
Platelets: 310 10*3/uL (ref 150.0–400.0)
RBC: 4.7 Mil/uL (ref 3.87–5.11)
RDW: 14 % (ref 11.5–15.5)
WBC: 5.7 10*3/uL (ref 4.0–10.5)

## 2018-02-23 LAB — BASIC METABOLIC PANEL
BUN: 11 mg/dL (ref 6–23)
CO2: 27 meq/L (ref 19–32)
CREATININE: 0.69 mg/dL (ref 0.40–1.20)
Calcium: 9.5 mg/dL (ref 8.4–10.5)
Chloride: 107 mEq/L (ref 96–112)
GFR: 89.94 mL/min (ref >60.00)
Glucose, Bld: 95 mg/dL (ref 70–99)
Potassium: 4.3 mEq/L (ref 3.5–5.1)
Sodium: 142 mEq/L (ref 135–145)

## 2018-02-23 LAB — HEPATIC FUNCTION PANEL
ALT: 16 U/L (ref 0–35)
AST: 16 U/L (ref 0–37)
Albumin: 4.5 g/dL (ref 3.5–5.2)
Alkaline Phosphatase: 43 U/L (ref 39–117)
BILIRUBIN DIRECT: 0.1 mg/dL (ref 0.0–0.3)
Total Bilirubin: 0.5 mg/dL (ref 0.2–1.2)
Total Protein: 7.4 g/dL (ref 6.0–8.3)

## 2018-02-23 LAB — URINALYSIS
BILIRUBIN URINE: NEGATIVE
KETONES UR: NEGATIVE
LEUKOCYTES UA: NEGATIVE
Nitrite: NEGATIVE
Total Protein, Urine: NEGATIVE
UROBILINOGEN UA: 0.2 (ref 0.0–1.0)
Urine Glucose: NEGATIVE
pH: 7 (ref 5.0–8.0)

## 2018-02-23 LAB — LIPID PANEL
CHOL/HDL RATIO: 3
Cholesterol: 195 mg/dL (ref 0–200)
HDL: 69.8 mg/dL (ref >39.00)
LDL Cholesterol: 109 mg/dL — ABNORMAL HIGH (ref 0–99)
NonHDL: 125.56
Triglycerides: 85 mg/dL (ref 0.0–149.0)
VLDL: 17 mg/dL (ref 0.0–40.0)

## 2018-02-23 LAB — TSH: TSH: 1.31 u[IU]/mL (ref 0.35–4.50)

## 2018-02-23 MED ORDER — RANITIDINE HCL 150 MG PO TABS: 150.0000 mg | ORAL_TABLET | Freq: Two times a day (BID) | ORAL | 3 refills | Status: DC

## 2018-02-23 MED ORDER — LOSARTAN POTASSIUM 50 MG PO TABS: 50.0000 mg | ORAL_TABLET | Freq: Every day | ORAL | 3 refills | Status: DC

## 2018-02-23 MED ORDER — ATORVASTATIN CALCIUM 10 MG PO TABS: 10.0000 mg | ORAL_TABLET | Freq: Every day | ORAL | 3 refills | Status: DC

## 2018-02-23 NOTE — Progress Notes (Signed)
Subjective:  Patient ID: [name] [name], female    DOB: 1950-09-27  Age: 67 y.o. MRN: [id number]  CC: No chief complaint on file.   HPI Curt Jewsamela B Bittinger presents for well exam  Outpatient Medications Prior to Visit  Medication Sig Dispense Refill  . Albuterol Sulfate (PROAIR RESPICLICK) 108 (90 Base) MCG/ACT AEPB Inhale 2 puffs into the lungs as needed.    Marland Kitchen. atorvastatin (LIPITOR) 10 MG tablet Take 1 tablet (10 mg total) by mouth daily. 90 tablet 3  . BREO ELLIPTA 200-25 MCG/INH AEPB Inhale 1 puff into the lungs daily.  6  . cholecalciferol (VITAMIN D) 1000 UNITS tablet Take 1,000 Units by mouth daily.      Marland Kitchen. loratadine (CLARITIN) 10 MG tablet Take 10 mg by mouth daily.      Marland Kitchen. losartan (COZAAR) 50 MG tablet Take 1 tablet (50 mg total) by mouth daily. 90 tablet 3  . mometasone (NASONEX) 50 MCG/ACT nasal spray Place 2 sprays into the nose as directed.      . ranitidine (ZANTAC) 150 MG tablet Take 1 tablet (150 mg total) by mouth 2 (two) times daily. 180 tablet 3  . Wheat Dextrin (BENEFIBER) POWD Take by mouth. 2 tsp daily.      No facility-administered medications prior to visit.     ROS: Review of Systems  Constitutional: Negative for activity change, appetite change, chills, fatigue and unexpected weight change.  HENT: Negative for congestion, mouth sores and sinus pressure.   Eyes: Negative for visual disturbance.  Respiratory: Negative for cough and chest tightness.   Gastrointestinal: Negative for abdominal pain and nausea.  Genitourinary: Negative for difficulty urinating, frequency and vaginal pain.  Musculoskeletal: Negative for back pain and gait problem.  Skin: Negative for pallor and rash.  Neurological: Negative for dizziness, tremors, weakness, numbness and headaches.  Psychiatric/Behavioral: Negative for confusion and sleep disturbance.    Objective:  BP 132/78 (BP Location: Left Arm, Patient Position: Sitting, Cuff Size: Normal)   Pulse 70   Temp 98.4 F  (36.9 C) (Oral)   Ht 5\' 6"  (1.676 m)   Wt 156 lb (70.8 kg)   SpO2 99%   BMI 25.18 kg/m   BP Readings from Last 3 Encounters:  02/23/18 132/78  02/17/17 (!) 146/82  02/28/16 129/80    Wt Readings from Last 3 Encounters:  02/23/18 156 lb (70.8 kg)  02/17/17 152 lb (68.9 kg)  02/28/16 152 lb (68.9 kg)    Physical Exam  Constitutional: She appears well-developed. No distress.  HENT:  Head: Normocephalic.  Right Ear: External ear normal.  Left Ear: External ear normal.  Nose: Nose normal.  Mouth/Throat: Oropharynx is clear and moist.  Eyes: Pupils are equal, round, and reactive to light. Conjunctivae are normal. Right eye exhibits no discharge. Left eye exhibits no discharge.  Neck: Normal range of motion. Neck supple. No JVD present. No tracheal deviation present. No thyromegaly present.  Cardiovascular: Normal rate, regular rhythm and normal heart sounds.  Pulmonary/Chest: No stridor. No respiratory distress. She has no wheezes.  Abdominal: Soft. Bowel sounds are normal. She exhibits no distension and no mass. There is no tenderness. There is no rebound and no guarding.  Musculoskeletal: She exhibits no edema or tenderness.  Lymphadenopathy:    She has no cervical adenopathy.  Neurological: She displays normal reflexes. No cranial nerve deficit. She exhibits normal muscle tone. Coordination normal.  Skin: No rash noted. No erythema.  Psychiatric: She has a normal mood and affect. Her  behavior is normal. Judgment and thought content normal.    Lab Results  Component Value Date   WBC 7.1 02/17/2017   HGB 14.4 02/17/2017   HCT 42.4 02/17/2017   PLT 323.0 02/17/2017   GLUCOSE 98 02/17/2017   CHOL 197 02/17/2017   TRIG 92.0 02/17/2017   HDL 64.00 02/17/2017   LDLCALC 115 (H) 02/17/2017   ALT 13 02/17/2017   AST 15 02/17/2017   NA 141 02/17/2017   K 3.8 02/17/2017   CL 106 02/17/2017   CREATININE 0.70 02/17/2017   BUN 13 02/17/2017   CO2 29 02/17/2017   TSH 1.19  02/17/2017    Mm Digital Screening Bilateral  Result Date: 07/13/2017 CLINICAL DATA:  Screening. EXAM: DIGITAL SCREENING BILATERAL MAMMOGRAM WITH CAD COMPARISON:  Previous exam(s). ACR Breast Density Category b: There are scattered areas of fibroglandular density. FINDINGS: There are no findings suspicious for malignancy. Images were processed with CAD. IMPRESSION: No mammographic evidence of malignancy. A result letter of this screening mammogram will be mailed directly to the patient. RECOMMENDATION: Screening mammogram in one year. (Code:SM-B-01Y) BI-RADS CATEGORY  1: Negative. Electronically Signed   By: Norva Pavlov M.D.   On: 07/13/2017 08:44    Assessment & Plan:   There are no diagnoses linked to this encounter.   No orders of the defined types were placed in this encounter.    Follow-up: No follow-ups on file.  Sonda Primes, MD

## 2018-02-23 NOTE — Assessment & Plan Note (Addendum)
Here for medicare wellness/physical  Diet: heart healthy  Physical activity: not sedentary  Depression/mood screen: negative  Hearing: intact to whispered voice  Visual acuity: grossly normal w/glasses, performs annual eye exam  ADLs: capable  Fall risk: low to none  Home safety: good  Cognitive evaluation: intact to orientation, naming, recall and repetition  EOL planning: adv directives, full code/ I agree  I have personally reviewed and have noted  1. The patient's medical, surgical and social history  2. Their use of alcohol, tobacco or illicit drugs  3. Their current medications and supplements  4. The patient's functional ability including ADL's, fall risks, home safety risks and hearing or visual impairment.  5. Diet and physical activities  6. Evidence for depression or mood disorders 7. The roster of all physicians providing medical care to patient - is listed in the Snapshot section of the chart and reviewed today.    Today patient counseled on age appropriate routine health concerns for screening and prevention, each reviewed and up to date or declined. Immunizations reviewed and up to date or declined. Labs ordered and reviewed. Risk factors for depression reviewed and negative. Hearing function and visual acuity are intact. ADLs screened and addressed as needed. Functional ability and level of safety reviewed and appropriate. Education, counseling and referrals performed based on assessed risks today. Patient provided with a copy of personalized plan for preventive services.   GYN - Dr Chestine Sporelark, Mammo q 12 mo  Colon due in 2020 Dr Marina GoodellPerry

## 2018-02-23 NOTE — Assessment & Plan Note (Signed)
Labs

## 2018-02-23 NOTE — Assessment & Plan Note (Signed)
Losartan 

## 2018-02-23 NOTE — Patient Instructions (Signed)
Health Maintenance for Postmenopausal Women Menopause is a normal process in which your reproductive ability comes to an end. This process happens gradually over a span of months to years, usually between the ages of 22 and 9. Menopause is complete when you have missed 12 consecutive menstrual periods. It is important to talk with your health care provider about some of the most common conditions that affect postmenopausal women, such as heart disease, cancer, and bone loss (osteoporosis). Adopting a healthy lifestyle and getting preventive care can help to promote your health and wellness. Those actions can also lower your chances of developing some of these common conditions. What should I know about menopause? During menopause, you may experience a number of symptoms, such as:  Moderate-to-severe hot flashes.  Night sweats.  Decrease in sex drive.  Mood swings.  Headaches.  Tiredness.  Irritability.  Memory problems.  Insomnia.  Choosing to treat or not to treat menopausal changes is an individual decision that you make with your health care provider. What should I know about hormone replacement therapy and supplements? Hormone therapy products are effective for treating symptoms that are associated with menopause, such as hot flashes and night sweats. Hormone replacement carries certain risks, especially as you become older. If you are thinking about using estrogen or estrogen with progestin treatments, discuss the benefits and risks with your health care provider. What should I know about heart disease and stroke? Heart disease, heart attack, and stroke become more likely as you age. This may be due, in part, to the hormonal changes that your body experiences during menopause. These can affect how your body processes dietary fats, triglycerides, and cholesterol. Heart attack and stroke are both medical emergencies. There are many things that you can do to help prevent heart disease  and stroke:  Have your blood pressure checked at least every 1-2 years. High blood pressure causes heart disease and increases the risk of stroke.  If you are 53-22 years old, ask your health care provider if you should take aspirin to prevent a heart attack or a stroke.  Do not use any tobacco products, including cigarettes, chewing tobacco, or electronic cigarettes. If you need help quitting, ask your health care provider.  It is important to eat a healthy diet and maintain a healthy weight. ? Be sure to include plenty of vegetables, fruits, low-fat dairy products, and lean protein. ? Avoid eating foods that are high in solid fats, added sugars, or salt (sodium).  Get regular exercise. This is one of the most important things that you can do for your health. ? Try to exercise for at least 150 minutes each week. The type of exercise that you do should increase your heart rate and make you sweat. This is known as moderate-intensity exercise. ? Try to do strengthening exercises at least twice each week. Do these in addition to the moderate-intensity exercise.  Know your numbers.Ask your health care provider to check your cholesterol and your blood glucose. Continue to have your blood tested as directed by your health care provider.  What should I know about cancer screening? There are several types of cancer. Take the following steps to reduce your risk and to catch any cancer development as early as possible. Breast Cancer  Practice breast self-awareness. ? This means understanding how your breasts normally appear and feel. ? It also means doing regular breast self-exams. Let your health care provider know about any changes, no matter how small.  If you are 40  or older, have a clinician do a breast exam (clinical breast exam or CBE) every year. Depending on your age, family history, and medical history, it may be recommended that you also have a yearly breast X-ray (mammogram).  If you  have a family history of breast cancer, talk with your health care provider about genetic screening.  If you are at high risk for breast cancer, talk with your health care provider about having an MRI and a mammogram every year.  Breast cancer (BRCA) gene test is recommended for women who have family members with BRCA-related cancers. Results of the assessment will determine the need for genetic counseling and BRCA1 and for BRCA2 testing. BRCA-related cancers include these types: ? Breast. This occurs in males or females. ? Ovarian. ? Tubal. This may also be called fallopian tube cancer. ? Cancer of the abdominal or pelvic lining (peritoneal cancer). ? Prostate. ? Pancreatic.  Cervical, Uterine, and Ovarian Cancer Your health care provider may recommend that you be screened regularly for cancer of the pelvic organs. These include your ovaries, uterus, and vagina. This screening involves a pelvic exam, which includes checking for microscopic changes to the surface of your cervix (Pap test).  For women ages 21-65, health care providers may recommend a pelvic exam and a Pap test every three years. For women ages 79-65, they may recommend the Pap test and pelvic exam, combined with testing for human papilloma virus (HPV), every five years. Some types of HPV increase your risk of cervical cancer. Testing for HPV may also be done on women of any age who have unclear Pap test results.  Other health care providers may not recommend any screening for nonpregnant women who are considered low risk for pelvic cancer and have no symptoms. Ask your health care provider if a screening pelvic exam is right for you.  If you have had past treatment for cervical cancer or a condition that could lead to cancer, you need Pap tests and screening for cancer for at least 20 years after your treatment. If Pap tests have been discontinued for you, your risk factors (such as having a new sexual partner) need to be  reassessed to determine if you should start having screenings again. Some women have medical problems that increase the chance of getting cervical cancer. In these cases, your health care provider may recommend that you have screening and Pap tests more often.  If you have a family history of uterine cancer or ovarian cancer, talk with your health care provider about genetic screening.  If you have vaginal bleeding after reaching menopause, tell your health care provider.  There are currently no reliable tests available to screen for ovarian cancer.  Lung Cancer Lung cancer screening is recommended for adults 69-62 years old who are at high risk for lung cancer because of a history of smoking. A yearly low-dose CT scan of the lungs is recommended if you:  Currently smoke.  Have a history of at least 30 pack-years of smoking and you currently smoke or have quit within the past 15 years. A pack-year is smoking an average of one pack of cigarettes per day for one year.  Yearly screening should:  Continue until it has been 15 years since you quit.  Stop if you develop a health problem that would prevent you from having lung cancer treatment.  Colorectal Cancer  This type of cancer can be detected and can often be prevented.  Routine colorectal cancer screening usually begins at  age 42 and continues through age 45.  If you have risk factors for colon cancer, your health care provider may recommend that you be screened at an earlier age.  If you have a family history of colorectal cancer, talk with your health care provider about genetic screening.  Your health care provider may also recommend using home test kits to check for hidden blood in your stool.  A small camera at the end of a tube can be used to examine your colon directly (sigmoidoscopy or colonoscopy). This is done to check for the earliest forms of colorectal cancer.  Direct examination of the colon should be repeated every  5-10 years until age 71. However, if early forms of precancerous polyps or small growths are found or if you have a family history or genetic risk for colorectal cancer, you may need to be screened more often.  Skin Cancer  Check your skin from head to toe regularly.  Monitor any moles. Be sure to tell your health care provider: ? About any new moles or changes in moles, especially if there is a change in a mole's shape or color. ? If you have a mole that is larger than the size of a pencil eraser.  If any of your family members has a history of skin cancer, especially at a young age, talk with your health care provider about genetic screening.  Always use sunscreen. Apply sunscreen liberally and repeatedly throughout the day.  Whenever you are outside, protect yourself by wearing long sleeves, pants, a wide-brimmed hat, and sunglasses.  What should I know about osteoporosis? Osteoporosis is a condition in which bone destruction happens more quickly than new bone creation. After menopause, you may be at an increased risk for osteoporosis. To help prevent osteoporosis or the bone fractures that can happen because of osteoporosis, the following is recommended:  If you are 46-71 years old, get at least 1,000 mg of calcium and at least 600 mg of vitamin D per day.  If you are older than age 55 but younger than age 65, get at least 1,200 mg of calcium and at least 600 mg of vitamin D per day.  If you are older than age 54, get at least 1,200 mg of calcium and at least 800 mg of vitamin D per day.  Smoking and excessive alcohol intake increase the risk of osteoporosis. Eat foods that are rich in calcium and vitamin D, and do weight-bearing exercises several times each week as directed by your health care provider. What should I know about how menopause affects my mental health? Depression may occur at any age, but it is more common as you become older. Common symptoms of depression  include:  Low or sad mood.  Changes in sleep patterns.  Changes in appetite or eating patterns.  Feeling an overall lack of motivation or enjoyment of activities that you previously enjoyed.  Frequent crying spells.  Talk with your health care provider if you think that you are experiencing depression. What should I know about immunizations? It is important that you get and maintain your immunizations. These include:  Tetanus, diphtheria, and pertussis (Tdap) booster vaccine.  Influenza every year before the flu season begins.  Pneumonia vaccine.  Shingles vaccine.  Your health care provider may also recommend other immunizations. This information is not intended to replace advice given to you by your health care provider. Make sure you discuss any questions you have with your health care provider. Document Released: 10/17/2005  Document Revised: 03/14/2016 Document Reviewed: 05/29/2015 Elsevier Interactive Patient Education  2018 [employer]  

## 2018-05-27 ENCOUNTER — Other Ambulatory Visit: Payer: Self-pay | Admitting: Internal Medicine

## 2018-05-27 DIAGNOSIS — Z1231 Encounter for screening mammogram for malignant neoplasm of breast: Secondary | ICD-10-CM

## 2018-06-23 ENCOUNTER — Ambulatory Visit (INDEPENDENT_AMBULATORY_CARE_PROVIDER_SITE_OTHER): Payer: Medicare Other

## 2018-06-23 DIAGNOSIS — Z23 Encounter for immunization: Secondary | ICD-10-CM | POA: Diagnosis not present

## 2018-07-01 ENCOUNTER — Ambulatory Visit: Payer: Medicare Other | Admitting: Family Medicine

## 2018-07-01 ENCOUNTER — Encounter: Payer: Self-pay | Admitting: Family Medicine

## 2018-07-01 ENCOUNTER — Other Ambulatory Visit: Payer: Medicare Other

## 2018-07-01 VITALS — BP 150/70 | HR 82 | Ht 66.0 in | Wt 157.0 lb

## 2018-07-01 DIAGNOSIS — R350 Frequency of micturition: Secondary | ICD-10-CM | POA: Diagnosis not present

## 2018-07-01 DIAGNOSIS — L609 Nail disorder, unspecified: Secondary | ICD-10-CM | POA: Diagnosis not present

## 2018-07-01 LAB — POCT URINALYSIS DIPSTICK
BILIRUBIN UA: NEGATIVE
Blood, UA: NEGATIVE
GLUCOSE UA: NEGATIVE
Ketones, UA: NEGATIVE
Nitrite, UA: NEGATIVE
Protein, UA: NEGATIVE
Spec Grav, UA: 1.02 (ref 1.010–1.025)
Urobilinogen, UA: 0.2 E.U./dL
pH, UA: 6 (ref 5.0–8.0)

## 2018-07-01 MED ORDER — CEPHALEXIN 500 MG PO CAPS: 500.0000 mg | ORAL_CAPSULE | Freq: Two times a day (BID) | ORAL | 0 refills | Status: DC

## 2018-07-01 NOTE — Progress Notes (Signed)
Patient ID: [name] [name], female   DOB: 1950/11/14, 67 y.o.   MRN: [id number]    PCP: Tresa Garter, MD  Subjective:  [name] [name] is a 67 y.o. year old very pleasant female patient who presents with Urinary Tract symptoms: symptoms including urinary frequency and bladder pressure that have been present for one week. Associated dysuria and urgency. -started: one week ago , symptoms are not improving  -previous treatments: No -She denies polyuria and nocturia  ROS-denies fever, chills, sweats, N/V, flank pain, or blood in urine  Right toe pain was present one week ago. She states that pain has resolved but she did notice some "liquid" that came from the area with pressure. She noted that the "liquid" and a very small amount of "gray liquid" was noted. She has seen a dermatologist regarding changes in her nail bed which was thought to be due to choice of shoe that was causing trauma to the nail. She has now started to wear tennis shoes and pain has resolved and no exudate has been present.   Pertinent Past Medical History- HTN   Medications- reviewed  Current Outpatient Medications  Medication Sig Dispense Refill  . Albuterol Sulfate (PROAIR RESPICLICK) 108 (90 Base) MCG/ACT AEPB Inhale 2 puffs into the lungs as needed.    Marland Kitchen atorvastatin (LIPITOR) 10 MG tablet Take 1 tablet (10 mg total) by mouth daily. 90 tablet 3  . BREO ELLIPTA 200-25 MCG/INH AEPB Inhale 1 puff into the lungs daily.  6  . cholecalciferol (VITAMIN D) 1000 UNITS tablet Take 1,000 Units by mouth daily.      . famotidine (PEPCID) 20 MG tablet Take 20 mg by mouth daily.    Marland Kitchen loratadine (CLARITIN) 10 MG tablet Take 10 mg by mouth daily.      Marland Kitchen losartan (COZAAR) 50 MG tablet Take 1 tablet (50 mg total) by mouth daily. 90 tablet 3  . mometasone (NASONEX) 50 MCG/ACT nasal spray Place 2 sprays into the nose as directed.      . Wheat Dextrin (BENEFIBER) POWD Take by mouth. 2 tsp daily.     . ranitidine (ZANTAC)  150 MG tablet Take 1 tablet (150 mg total) by mouth 2 (two) times daily. (Patient not taking: Reported on 07/01/2018) 180 tablet 3   No current facility-administered medications for this visit.     Objective: BP (!) 150/70 (BP Location: Left Arm, Patient Position: Sitting, Cuff Size: Normal)   Pulse 82   Ht 5\' 6"  (1.676 m)   Wt 157 lb (71.2 kg)   SpO2 97%   BMI 25.34 kg/m  Gen: NAD, resting comfortably HEENT: Oropharynx is clear and moist CV: RRR no murmurs rubs or gallops Lungs: CTAB no crackles, wheeze, rhonchi Abdomen: soft/nontender/nondistended/normal bowel sounds. No rebound or guarding.  No CVA tenderness.   Suprapubic tenderness present Ext: no edema Skin: warm, dry, no rash, right great toe nail bed exhibits mild color changes. No evidence of erythema, pain, fluctuance, or drainage present.  Neuro: grossly normal, moves all extremities  Assessment/Plan: 1. Urinary frequency - POCT urinalysis dipstick - Urine Culture; Future  UA indicates Leukocytes 3+.  History and exam today are suggestive of UTI.  Will send for culture. Empiric treatment with Cephalexin. Augmentin is noted in allergy profile. Patient stated that Augmentin caused nausea however she has successfully taken amoxicillin in the past without difficulty. She denies allergy to amoxicillin or cephalosporin medications.   Advised patient to complete antibiotic and she should follow up if  her symptoms do not improve in 2 to 3 days, worsen, she develops a fever >101, or back pain. Also advised her to force fluids and she can use OTC Azo for symptom relief temporarily if needed. Patient voiced understanding and agreed with plan.   Finally, we reviewed reasons to return to care including if symptoms worsen or persist or new concerns arise- once again particularly fever, N/V, or flank pain.  2. Nail abnormalities Right great toe pain has resolved. No evidence of infection present. Advised patient to wear comfortable,  supportive shoes. Advised evaluation with podiatrist if symptoms occur again. She voiced understanding and agreed with plan    Inez Catalina, FNP

## 2018-07-01 NOTE — Patient Instructions (Signed)
Please complete antibiotic as directed. If you develop symptoms of fever >101, pain in your back, or do not improve with treatment that has been provided in 3 to 4 days, please follow up for further evaluation and treatment.  If symptoms return in your toe, advise seeing a podiatrist for symptoms. [facility] has providers. Dr. Charlsie Merles is one option you can consider.   Urinary Tract Infection, Adult A urinary tract infection (UTI) is an infection of any part of the urinary tract. The urinary tract includes the:  Kidneys.  Ureters.  Bladder.  Urethra.  These organs make, store, and get rid of pee (urine) in the body. Follow these instructions at home:  Take over-the-counter and prescription medicines only as told by your doctor.  If you were prescribed an antibiotic medicine, take it as told by your doctor. Do not stop taking the antibiotic even if you start to feel better.  Avoid the following drinks: ? Alcohol. ? Caffeine. ? Tea. ? Carbonated drinks.  Drink enough fluid to keep your pee clear or pale yellow.  Keep all follow-up visits as told by your doctor. This is important.  Make sure to: ? Empty your bladder often and completely. Do not to hold pee for long periods of time. ? Empty your bladder before and after sex. ? Wipe from front to back after a bowel movement if you are female. Use each tissue one time when you wipe. Contact a doctor if:  You have back pain.  You have a fever.  You feel sick to your stomach (nauseous).  You throw up (vomit).  Your symptoms do not get better after 3 days.  Your symptoms go away and then come back. Get help right away if:  You have very bad back pain.  You have very bad lower belly (abdominal) pain.  You are throwing up and cannot keep down any medicines or water. This information is not intended to replace advice given to you by your health care provider. Make sure you discuss any questions you have with your  health care provider. Document Released: 02/11/2008 Document Revised: 01/31/2016 Document Reviewed: 07/16/2015 Elsevier Interactive Patient Education  [employer].

## 2018-07-02 LAB — URINE CULTURE
MICRO NUMBER:: 91280796
Result:: NO GROWTH
SPECIMEN QUALITY:: ADEQUATE

## 2018-07-07 ENCOUNTER — Encounter (HOSPITAL_COMMUNITY): Payer: Self-pay | Admitting: Emergency Medicine

## 2018-07-07 ENCOUNTER — Ambulatory Visit (HOSPITAL_COMMUNITY)
Admission: EM | Admit: 2018-07-07 | Discharge: 2018-07-07 | Disposition: A | Discharge status: Home / Self Care | Payer: Medicare Other | Attending: Family Medicine | Admitting: Family Medicine

## 2018-07-07 DIAGNOSIS — S61313A Laceration without foreign body of left middle finger with damage to nail, initial encounter: Secondary | ICD-10-CM

## 2018-07-07 DIAGNOSIS — W230XXA Caught, crushed, jammed, or pinched between moving objects, initial encounter: Secondary | ICD-10-CM | POA: Diagnosis not present

## 2018-07-07 NOTE — ED Triage Notes (Signed)
Pt states today she slammed her L middle finger in the car door. Small lac on the lower part of finger. Bleeding controlled.

## 2018-07-07 NOTE — ED Provider Notes (Signed)
[facility]    CSN: [id number] Arrival date & time: 07/07/18  1237     History   Chief Complaint Chief Complaint  Patient presents with  . Finger Laceration    HPI [name] [name] is a 67 y.o. female.   Pt here for left middle finger pain and laceration after shutting her hand in the car door PTA. There is a small superficial laceration with slow bleeding just below the left DIP joint, dorsal.  She has good flexion and extension of the finger and no swelling. Her symptoms have been constant. She has not done anything for the problem. No numbness, tingling or loss of sensation.   ROS per HPI      Past Medical History:  Diagnosis Date  . Allergic rhinitis   . Anemia    NOS  . Asthma   . Carotid stenosis    mild  . GERD (gastroesophageal reflux disease)    Dr. Marina Goodell  . Hyperlipidemia   . Migraine   . Osteopenia     Patient Active Problem List   Diagnosis Date Noted  . Thoracic back pain 11/14/2014  . Seromucinous otitis media 07/03/2011  . HTN (hypertension) 07/03/2011  . Well adult exam 02/27/2011  . CONSTIPATION, CHRONIC 10/28/2010  . NAUSEA 10/28/2010  . ABDOMINAL PAIN, EPIGASTRIC 10/28/2010  . BRONCHITIS, ACUTE 10/16/2009  . UPPER RESPIRATORY INFECTION (URI) 07/21/2008  . VERTIGO 07/21/2008  . CONTACT DERMATITIS&OTHER ECZEMA DUE UNSPEC CAUSE 06/15/2008  . OTHER SPECIFIED PRURITIC CONDITIONS 06/15/2008  . INSOMNIA 05/08/2008  . BURN OF UNSPECIFIED DEGREE OF PALM OF HAND 05/08/2008  . CERVICALGIA 03/30/2008  . HEMORRHOIDS 11/03/2007  . ESOPHAGEAL STRICTURE 11/03/2007  . OSTEOARTHRITIS 11/03/2007  . HEADACHE 11/03/2007  . ROTATOR CUFF SPRAIN AND STRAIN 11/03/2007  . Unspecified otitis media 10/08/2007  . ANEMIA-NOS 10/04/2007  . ALLERGIC RHINITIS 10/04/2007  . GERD 08/31/2007  . Dyslipidemia 07/27/2007  . CAROTID ARTERY STENOSIS 07/27/2007  . Asthma 07/27/2007  . OSTEOPENIA 07/27/2007    Past Surgical History:  Procedure  Laterality Date  . TUBAL LIGATION      OB History   None      Home Medications    Prior to Admission medications   Medication Sig Start Date End Date Taking? Authorizing Provider  Albuterol Sulfate (PROAIR RESPICLICK) 108 (90 Base) MCG/ACT AEPB Inhale 2 puffs into the lungs as needed.    [provider]  atorvastatin (LIPITOR) 10 MG tablet Take 1 tablet (10 mg total) by mouth daily. 02/23/18   Plotnikov, Georgina Quint, MD  BREO ELLIPTA 200-25 MCG/INH AEPB Inhale 1 puff into the lungs daily. 02/15/16   [provider]  cephALEXin (KEFLEX) 500 MG capsule Take 1 capsule (500 mg total) by mouth 2 (two) times daily. 07/01/18   Roddie Mc, FNP  cholecalciferol (VITAMIN D) 1000 UNITS tablet Take 1,000 Units by mouth daily.      [provider]  famotidine (PEPCID) 20 MG tablet Take 20 mg by mouth daily.    [provider]  loratadine (CLARITIN) 10 MG tablet Take 10 mg by mouth daily.      [provider]  losartan (COZAAR) 50 MG tablet Take 1 tablet (50 mg total) by mouth daily. 02/23/18   Plotnikov, Georgina Quint, MD  mometasone (NASONEX) 50 MCG/ACT nasal spray Place 2 sprays into the nose as directed.      [provider]  ranitidine (ZANTAC) 150 MG tablet Take 1 tablet (150 mg total) by mouth 2 (two)  times daily. Patient not taking: Reported on 07/01/2018 02/23/18   Plotnikov, Georgina Quint, MD  Wheat Dextrin (BENEFIBER) POWD Take by mouth. 2 tsp daily.     [provider]    Family History Family History  Problem Relation Age of Onset  . Cancer Father 38       Chest  . Hypertension Mother     Social History Social History   Tobacco Use  . Smoking status: Never Smoker  . Smokeless tobacco: Never Used  Substance Use Topics  . Alcohol use: No  . Drug use: No     Allergies   Alendronate sodium and Amoxicillin-pot clavulanate   Review of Systems Review of Systems   Physical Exam Triage Vital Signs ED Triage Vitals    Enc Vitals Group     BP 07/07/18 1259 (!) 149/58     Pulse Rate 07/07/18 1259 89     Resp 07/07/18 1259 16     Temp 07/07/18 1259 98.3 F (36.8 C)     Temp src --      SpO2 07/07/18 1259 98 %     Weight --      Height --      Head Circumference --      Peak Flow --      Pain Score 07/07/18 1300 2     Pain Loc --      Pain Edu? --      Excl. in GC? --    No data found.  Updated Vital Signs BP (!) 149/58   Pulse 89   Temp 98.3 F (36.8 C)   Resp 16   SpO2 98%   Visual Acuity Right Eye Distance:   Left Eye Distance:   Bilateral Distance:    Right Eye Near:   Left Eye Near:    Bilateral Near:     Physical Exam  Constitutional: She appears well-developed and well-nourished.  Very pleasant. Non toxic or ill appearing.   HENT:  Head: Normocephalic and atraumatic.  Eyes: Conjunctivae are normal.  Neck: Normal range of motion.  Pulmonary/Chest: Effort normal.  Musculoskeletal: Normal range of motion. She exhibits tenderness. She exhibits no edema or deformity.  Good flexion and extension of the left middle finger.  Small laceration inferior to DIP.   Neurological: She is alert.  Skin: Skin is warm and dry.  Psychiatric: She has a normal mood and affect.  Nursing note and vitals reviewed.    UC Treatments / Results  Labs (all labs ordered are listed, but only abnormal results are displayed) Labs Reviewed - No data to display  EKG None  Radiology No results found.  Procedures Laceration Repair Date/Time: 07/07/2018 3:30 PM Performed by: Janace Aris, NP Authorized by: Mardella Layman, MD   Consent:    Consent obtained:  Verbal   Consent given by:  Patient Anesthesia (see MAR for exact dosages):    Anesthesia method:  None Laceration details:    Location:  Finger   Finger location:  L long finger   Length (cm):  0.5 Repair type:    Repair type:  Simple Exploration:    Hemostasis achieved with:  Direct pressure   Contaminated: no   Treatment:     Area cleansed with:  Soap and water   Amount of cleaning:  Standard   Visualized foreign bodies/material removed: no   Skin repair:    Repair method:  Tissue adhesive Approximation:    Approximation:  Close Post-procedure details:  Dressing:  Open (no dressing)   (including critical care time)  Medications Ordered in UC Medications - No data to display  Initial Impression / Assessment and Plan / UC Course  I have reviewed the triage vital signs and the nursing notes.  Pertinent labs & imaging results that were available during my care of the patient were reviewed by me and considered in my medical decision making (see chart for details).     Patient has good flexion-extension of the finger No need for x-ray Wound cleaned and placed Dermabond for closure Instructions given Final Clinical Impressions(s) / UC Diagnoses   Final diagnoses:  Laceration of left middle finger without foreign body with damage to nail, initial encounter     Discharge Instructions     There is some instructions in your discharge papers to take care of a wound with Derma bond Follow up as needed for continued or worsening symptoms      ED Prescriptions    None     Controlled Substance Prescriptions [place] Controlled Substance Registry consulted? Not Applicable   Janace Aris, NP 07/07/18 1531

## 2018-07-07 NOTE — Discharge Instructions (Addendum)
There is some instructions in your discharge papers to take care of a wound with Derma bond Follow up as needed for continued or worsening symptoms

## 2018-07-13 ENCOUNTER — Encounter: Payer: Self-pay | Admitting: Internal Medicine

## 2018-07-13 ENCOUNTER — Ambulatory Visit: Payer: Medicare Other | Admitting: Internal Medicine

## 2018-07-13 ENCOUNTER — Ambulatory Visit
Admission: RE | Admit: 2018-07-13 | Discharge: 2018-07-13 | Disposition: A | Discharge status: Home / Self Care | Payer: Medicare Other | Source: Ambulatory Visit | Attending: Internal Medicine | Admitting: Internal Medicine

## 2018-07-13 DIAGNOSIS — S61219A Laceration without foreign body of unspecified finger without damage to nail, initial encounter: Secondary | ICD-10-CM | POA: Insufficient documentation

## 2018-07-13 DIAGNOSIS — Z1231 Encounter for screening mammogram for malignant neoplasm of breast: Secondary | ICD-10-CM

## 2018-07-13 DIAGNOSIS — K219 Gastro-esophageal reflux disease without esophagitis: Secondary | ICD-10-CM

## 2018-07-13 DIAGNOSIS — S61213D Laceration without foreign body of left middle finger without damage to nail, subsequent encounter: Secondary | ICD-10-CM

## 2018-07-13 MED ORDER — FAMOTIDINE 40 MG PO TABS: 40.0000 mg | ORAL_TABLET | Freq: Every day | ORAL | 3 refills | Status: DC

## 2018-07-13 NOTE — Patient Instructions (Signed)
Dermabond is a powerful adhesive designed to replace bandages and stitches. It's designed to fall off over time, but you can remove it yourself if needed. Consult a medical professional to be sure it should be removed. Things You'll Need Vaseline or other petroleum-based product  Acetone Vinegar  Vegetable oil Removal If the Dermabond was applied within the past 45 seconds or so, you can just wipe it away. Dermabond is designed to flake off on its own after about a week, so check to see whether it is. Picking at Dermabond can help it come off. But stop if the picking is causing undue pain. If the Dermabond really needs to come off, liberally apply Vaseline or acetone and rub it in. This should loosen the bond with the skin and allow you to pull the Dermabond off. Soak the area in warm water if the Vaseline hasn't succeeded on its own. If the Dermabond still doesn't come off, consult a doctor again instead of trying anything more drastic. Tips & Warnings If the Dermabond is stuck in the hair it might take longer to flake off. Try putting vinegar, vegetable oil or tea tree oil on the material, using cotton balls or swabs. Removing the Dermabond too soon can cause the wound to re-open, which could lead to scarring or infection. If the Dermabond is too close to the eyes to safely remove, do not attempt it.

## 2018-07-13 NOTE — Assessment & Plan Note (Signed)
Pepcid Rx

## 2018-07-13 NOTE — Assessment & Plan Note (Signed)
L 3d finger laceration covered w/a thick clump of dermabond w/dermabond - small bleeding from underneath  No redness, pain Remove dermabond if no spontaneous detachment

## 2018-07-13 NOTE — Progress Notes (Signed)
   Subjective:  Patient ID: [name] [name], female    DOB: 07/25/1951  Age: 67 y.o. MRN: [id number]  CC: No chief complaint on file.   HPI [name] [name] presents for L 3d finger laceration treated at UC 1 week ago w/dermabond - bleeding from underneath F/u GERD -- off Ranitidine  Outpatient Medications Prior to Visit  Medication Sig Dispense Refill  . Albuterol Sulfate (PROAIR RESPICLICK) 108 (90 Base) MCG/ACT AEPB Inhale 2 puffs into the lungs as needed.    Marland Kitchen atorvastatin (LIPITOR) 10 MG tablet Take 1 tablet (10 mg total) by mouth daily. 90 tablet 3  . BREO ELLIPTA 200-25 MCG/INH AEPB Inhale 1 puff into the lungs daily.  6  . cephALEXin (KEFLEX) 500 MG capsule Take 1 capsule (500 mg total) by mouth 2 (two) times daily. 14 capsule 0  . cholecalciferol (VITAMIN D) 1000 UNITS tablet Take 1,000 Units by mouth daily.      . famotidine (PEPCID) 20 MG tablet Take 20 mg by mouth daily.    Marland Kitchen loratadine (CLARITIN) 10 MG tablet Take 10 mg by mouth daily.      Marland Kitchen losartan (COZAAR) 50 MG tablet Take 1 tablet (50 mg total) by mouth daily. 90 tablet 3  . mometasone (NASONEX) 50 MCG/ACT nasal spray Place 2 sprays into the nose as directed.      . ranitidine (ZANTAC) 150 MG tablet Take 1 tablet (150 mg total) by mouth 2 (two) times daily. 180 tablet 3  . Wheat Dextrin (BENEFIBER) POWD Take by mouth. 2 tsp daily.      No facility-administered medications prior to visit.     ROS: Review of Systems  Constitutional: Negative for chills and fever.  Skin: Positive for wound. Negative for color change.    Objective:  BP 130/76 (BP Location: Left Arm, Patient Position: Sitting, Cuff Size: Normal)   Pulse 74   Temp 98.2 F (36.8 C) (Oral)   Ht 5\' 6"  (1.676 m)   Wt 158 lb (71.7 kg)   SpO2 97%   BMI 25.50 kg/m   BP Readings from Last 3 Encounters:  07/13/18 130/76  07/07/18 (!) 149/58  07/01/18 (!) 150/70    Wt Readings from Last 3 Encounters:  07/13/18 158 lb (71.7 kg)  07/01/18 157  lb (71.2 kg)  02/23/18 156 lb (70.8 kg)    Physical Exam L 3d finger laceration covered w/a thick clump of dermabond w/dermabond - small bleeding from underneath  No redness, pain  Lab Results  Component Value Date   WBC 5.7 02/23/2018   HGB 14.0 02/23/2018   HCT 40.8 02/23/2018   PLT 310.0 02/23/2018   GLUCOSE 95 02/23/2018   CHOL 195 02/23/2018   TRIG 85.0 02/23/2018   HDL 69.80 02/23/2018   LDLCALC 109 (H) 02/23/2018   ALT 16 02/23/2018   AST 16 02/23/2018   NA 142 02/23/2018   K 4.3 02/23/2018   CL 107 02/23/2018   CREATININE 0.69 02/23/2018   BUN 11 02/23/2018   CO2 27 02/23/2018   TSH 1.31 02/23/2018    No results found.  Assessment & Plan:   There are no diagnoses linked to this encounter.   No orders of the defined types were placed in this encounter.    Follow-up: No follow-ups on file.  Sonda Primes, MD

## 2018-07-22 ENCOUNTER — Encounter: Payer: Self-pay | Admitting: Internal Medicine

## 2018-07-22 ENCOUNTER — Ambulatory Visit: Payer: Medicare Other | Admitting: Internal Medicine

## 2018-07-22 DIAGNOSIS — J040 Acute laryngitis: Secondary | ICD-10-CM | POA: Diagnosis not present

## 2018-07-22 MED ORDER — AZITHROMYCIN 250 MG PO TABS: ORAL_TABLET | ORAL | 0 refills | Status: DC

## 2018-07-22 NOTE — Assessment & Plan Note (Signed)
Voice rest Zpac if worse

## 2018-07-22 NOTE — Progress Notes (Signed)
Subjective:  Patient ID: [name] [name], female    DOB: Nov 27, 1950  Age: 67 y.o. MRN: [id number]  CC: No chief complaint on file.   HPI Melaya Hoselton Lukacs presents for URI sx's since Mon C/o voice loss  Outpatient Medications Prior to Visit  Medication Sig Dispense Refill  . Albuterol Sulfate (PROAIR RESPICLICK) 108 (90 Base) MCG/ACT AEPB Inhale 2 puffs into the lungs as needed.    Marland Kitchen atorvastatin (LIPITOR) 10 MG tablet Take 1 tablet (10 mg total) by mouth daily. 90 tablet 3  . BREO ELLIPTA 200-25 MCG/INH AEPB Inhale 1 puff into the lungs daily.  6  . cephALEXin (KEFLEX) 500 MG capsule Take 1 capsule (500 mg total) by mouth 2 (two) times daily. 14 capsule 0  . cholecalciferol (VITAMIN D) 1000 UNITS tablet Take 1,000 Units by mouth daily.      . famotidine (PEPCID) 20 MG tablet Take 20 mg by mouth daily.    . famotidine (PEPCID) 40 MG tablet Take 1 tablet (40 mg total) by mouth daily. 90 tablet 3  . loratadine (CLARITIN) 10 MG tablet Take 10 mg by mouth daily.      Marland Kitchen losartan (COZAAR) 50 MG tablet Take 1 tablet (50 mg total) by mouth daily. 90 tablet 3  . mometasone (NASONEX) 50 MCG/ACT nasal spray Place 2 sprays into the nose as directed.      . Wheat Dextrin (BENEFIBER) POWD Take by mouth. 2 tsp daily.      No facility-administered medications prior to visit.     ROS: Review of Systems  Constitutional: Negative for activity change, appetite change, chills, fatigue and unexpected weight change.  HENT: Positive for congestion and voice change. Negative for mouth sores and sinus pressure.   Eyes: Negative for visual disturbance.  Respiratory: Negative for cough and chest tightness.   Gastrointestinal: Negative for abdominal pain and nausea.  Genitourinary: Negative for difficulty urinating, frequency and vaginal pain.  Musculoskeletal: Negative for back pain and gait problem.  Skin: Negative for pallor and rash.  Neurological: Negative for dizziness, tremors, weakness,  numbness and headaches.  Psychiatric/Behavioral: Negative for confusion and sleep disturbance.    Objective:  BP 128/78 (BP Location: Left Arm, Patient Position: Sitting, Cuff Size: Normal)   Pulse 75   Temp 97.7 F (36.5 C) (Oral)   Ht 5\' 6"  (1.676 m)   Wt 157 lb (71.2 kg)   SpO2 98%   BMI 25.34 kg/m   BP Readings from Last 3 Encounters:  07/22/18 128/78  07/13/18 130/76  07/07/18 (!) 149/58    Wt Readings from Last 3 Encounters:  07/22/18 157 lb (71.2 kg)  07/13/18 158 lb (71.7 kg)  07/01/18 157 lb (71.2 kg)    Physical Exam  Constitutional: She appears well-developed. No distress.  HENT:  Head: Normocephalic.  Right Ear: External ear normal.  Left Ear: External ear normal.  Nose: Nose normal.  Mouth/Throat: Oropharynx is clear and moist.  Eyes: Pupils are equal, round, and reactive to light. Conjunctivae are normal. Right eye exhibits no discharge. Left eye exhibits no discharge.  Neck: Normal range of motion. Neck supple. No JVD present. No tracheal deviation present. No thyromegaly present.  Cardiovascular: Normal rate, regular rhythm and normal heart sounds.  Pulmonary/Chest: No stridor. No respiratory distress. She has no wheezes.  Abdominal: Soft. Bowel sounds are normal. She exhibits no distension and no mass. There is no tenderness. There is no rebound and no guarding.  Musculoskeletal: She exhibits no edema or tenderness.  Lymphadenopathy:    She has no cervical adenopathy.  Neurological: She displays normal reflexes. No cranial nerve deficit. She exhibits normal muscle tone. Coordination normal.  Skin: No rash noted. No erythema.  Psychiatric: She has a normal mood and affect. Her behavior is normal. Judgment and thought content normal.  hoarse eryth throat  Lab Results  Component Value Date   WBC 5.7 02/23/2018   HGB 14.0 02/23/2018   HCT 40.8 02/23/2018   PLT 310.0 02/23/2018   GLUCOSE 95 02/23/2018   CHOL 195 02/23/2018   TRIG 85.0 02/23/2018    HDL 69.80 02/23/2018   LDLCALC 109 (H) 02/23/2018   ALT 16 02/23/2018   AST 16 02/23/2018   NA 142 02/23/2018   K 4.3 02/23/2018   CL 107 02/23/2018   CREATININE 0.69 02/23/2018   BUN 11 02/23/2018   CO2 27 02/23/2018   TSH 1.31 02/23/2018    Mm 3d Screen Breast Bilateral  Result Date: 07/14/2018 CLINICAL DATA:  Screening. EXAM: DIGITAL SCREENING BILATERAL MAMMOGRAM WITH TOMO AND CAD COMPARISON:  Previous exam(s). ACR Breast Density Category b: There are scattered areas of fibroglandular density. FINDINGS: There are no findings suspicious for malignancy. Images were processed with CAD. IMPRESSION: No mammographic evidence of malignancy. A result letter of this screening mammogram will be mailed directly to the patient. RECOMMENDATION: Screening mammogram in one year. (Code:SM-B-01Y) BI-RADS CATEGORY  1: Negative. Electronically Signed   By: Edwin CapJennifer  Jarosz M.D.   On: 07/14/2018 07:39    Assessment & Plan:   There are no diagnoses linked to this encounter.   No orders of the defined types were placed in this encounter.    Follow-up: No follow-ups on file.  Sonda PrimesAlex , MD

## 2018-07-22 NOTE — Patient Instructions (Signed)
Voice rest  You can use over-the-counter  "cold" medicines  such as " Delsym" or" Robitussin" cough syrup varietis for cough.  You can use plain "Tylenol" or "Advil" for fever, chills and achyness. Use Halls or Ricola cough drops.   "Common cold" symptoms are usually triggered by a virus.  The antibiotics are usually not necessary. On average, a" viral cold" illness would take 4-7 days to resolve.   Please, make an appointment if you are not better or if you're worse.

## 2018-08-18 ENCOUNTER — Encounter: Payer: Self-pay | Admitting: Podiatry

## 2018-08-18 ENCOUNTER — Other Ambulatory Visit: Payer: Self-pay | Admitting: Podiatry

## 2018-08-18 ENCOUNTER — Ambulatory Visit (INDEPENDENT_AMBULATORY_CARE_PROVIDER_SITE_OTHER): Payer: Medicare Other

## 2018-08-18 ENCOUNTER — Ambulatory Visit: Payer: Medicare Other | Admitting: Podiatry

## 2018-08-18 DIAGNOSIS — M79672 Pain in left foot: Secondary | ICD-10-CM | POA: Diagnosis not present

## 2018-08-18 DIAGNOSIS — M722 Plantar fascial fibromatosis: Secondary | ICD-10-CM | POA: Diagnosis not present

## 2018-08-18 DIAGNOSIS — L600 Ingrowing nail: Secondary | ICD-10-CM | POA: Diagnosis not present

## 2018-08-18 MED ORDER — TRIAMCINOLONE ACETONIDE 10 MG/ML IJ SUSP
10.0000 mg | Freq: Once | INTRAMUSCULAR | Status: AC
  Administered 2018-08-18: 10 mg

## 2018-08-18 NOTE — Progress Notes (Signed)
Subjective:   Patient ID: [name] CaoPamela B [name], female   DOB: 67 y.o.   MRN: [id number]   HPI Patient presents stating she is had trouble with thickness and deformity of the right big toenail with lifting of the nailbed and on the left she has developed discomfort in the arch for 6 months which is worse when she gets up in the morning or when she tries to walk.  Does not remember specific injury and patient does not smoke and likes to be active   Review of Systems  All other systems reviewed and are negative.       Objective:  Physical Exam  Constitutional: She appears well-developed and well-nourished.  Cardiovascular: Intact distal pulses.  Pulmonary/Chest: Effort normal.  Musculoskeletal: Normal range of motion.  Neurological: She is alert.  Skin: Skin is warm.  Nursing note and vitals reviewed.   Neurovascular status intact muscle strength is adequate range of motion within normal limits with a deformed hallux nail right with looseness of the bed and discoloration and on the left arch distal to the insertion calcaneus there is inflammation of the plantar fascia with moderate depression of the arch and swelling.  Patient was noted to have good digital perfusion well oriented x3     Assessment:  Damage right hallux nail with looseness of the bed and plantar fasciitis left with fluid buildup     Plan:  H&P conditions reviewed and for the right I have recommended nail removal and left I went ahead today did sterile prep and injected the fascia 3 mg Kenalog 5 g Xylocaine distal to insertion calcaneus.  Applied fascial brace left to lift up the arch and for the right I infiltrated the hallux 60 mg like Marcaine mixture and using sterile his mentation remove the nail cleaned out the bed and will allow new nail to regrow with sterile dressing applied.  Patient understands it may regrow abnormally and ultimately permanent procedure may be necessary  X-ray indicates that there is no spurring  no indications of fracture or advanced arthritic processes of the left arch forefoot

## 2018-08-18 NOTE — Patient Instructions (Signed)

## 2018-09-20 ENCOUNTER — Encounter: Payer: Self-pay | Admitting: Family

## 2018-09-20 ENCOUNTER — Ambulatory Visit: Payer: Medicare Other | Admitting: Family

## 2018-09-20 VITALS — BP 130/72 | HR 82 | Temp 97.6°F | Ht 66.0 in | Wt 160.0 lb

## 2018-09-20 DIAGNOSIS — J019 Acute sinusitis, unspecified: Secondary | ICD-10-CM | POA: Diagnosis not present

## 2018-09-20 MED ORDER — FLUTICASONE PROPIONATE 50 MCG/ACT NA SUSP: 2.0000 | Freq: Every day | NASAL | 6 refills | Status: DC

## 2018-09-20 MED ORDER — CEFDINIR 300 MG PO CAPS: 300.0000 mg | ORAL_CAPSULE | Freq: Two times a day (BID) | ORAL | 0 refills | Status: DC

## 2018-09-20 NOTE — Progress Notes (Signed)
[name] [name] is a 68 y.o. female with the following history as recorded in [employer]:  Patient Active Problem List   Diagnosis Date Noted  . Laryngitis 07/22/2018  . Finger laceration 07/13/2018  . Hypercholesterolemia 11/16/2017  . Osteopenia 11/16/2017  . Thoracic back pain 11/14/2014  . Seromucinous otitis media 07/03/2011  . HTN (hypertension) 07/03/2011  . Well adult exam 02/27/2011  . CONSTIPATION, CHRONIC 10/28/2010  . NAUSEA 10/28/2010  . ABDOMINAL PAIN, EPIGASTRIC 10/28/2010  . BRONCHITIS, ACUTE 10/16/2009  . UPPER RESPIRATORY INFECTION (URI) 07/21/2008  . VERTIGO 07/21/2008  . CONTACT DERMATITIS&OTHER ECZEMA DUE UNSPEC CAUSE 06/15/2008  . OTHER SPECIFIED PRURITIC CONDITIONS 06/15/2008  . INSOMNIA 05/08/2008  . BURN OF UNSPECIFIED DEGREE OF PALM OF HAND 05/08/2008  . CERVICALGIA 03/30/2008  . HEMORRHOIDS 11/03/2007  . ESOPHAGEAL STRICTURE 11/03/2007  . OSTEOARTHRITIS 11/03/2007  . HEADACHE 11/03/2007  . ROTATOR CUFF SPRAIN AND STRAIN 11/03/2007  . Unspecified otitis media 10/08/2007  . ANEMIA-NOS 10/04/2007  . ALLERGIC RHINITIS 10/04/2007  . GERD 08/31/2007  . Dyslipidemia 07/27/2007  . CAROTID ARTERY STENOSIS 07/27/2007  . Asthma 07/27/2007  . OSTEOPENIA 07/27/2007    Current Outpatient Medications  Medication Sig Dispense Refill  . Albuterol Sulfate (PROAIR RESPICLICK) 108 (90 Base) MCG/ACT AEPB Inhale 2 puffs into the lungs as needed.    Marland Kitchen. atorvastatin (LIPITOR) 10 MG tablet Take 1 tablet (10 mg total) by mouth daily. 90 tablet 3  . BREO ELLIPTA 200-25 MCG/INH AEPB Inhale 1 puff into the lungs daily.  6  . cholecalciferol (VITAMIN D) 1000 UNITS tablet Take 1,000 Units by mouth daily.      . famotidine (PEPCID) 40 MG tablet Take 1 tablet (40 mg total) by mouth daily. 90 tablet 3  . loratadine (CLARITIN) 10 MG tablet Take 10 mg by mouth daily.      Marland Kitchen. losartan (COZAAR) 50 MG tablet Take 1 tablet (50 mg total) by mouth daily. 90 tablet 3  . cefdinir  (OMNICEF) 300 MG capsule Take 1 capsule (300 mg total) by mouth 2 (two) times daily. 20 capsule 0  . fluticasone (FLONASE) 50 MCG/ACT nasal spray Place 2 sprays into both nostrils daily. 16 g 6   No current facility-administered medications for this visit.     Allergies: Alendronate sodium and Amoxicillin-pot clavulanate  Past Medical History:  Diagnosis Date  . Allergic rhinitis   . Anemia    NOS  . Asthma   . Carotid stenosis    mild  . GERD (gastroesophageal reflux disease)    Dr. Marina GoodellPerry  . Hyperlipidemia   . Migraine   . Osteopenia     Past Surgical History:  Procedure Laterality Date  . TUBAL LIGATION      Family History  Problem Relation Age of Onset  . Cancer Father 2452       Chest  . Hypertension Mother     Social History   Tobacco Use  . Smoking status: Never Smoker  . Smokeless tobacco: Never Used  Substance Use Topics  . Alcohol use: No    Subjective:  Patient presents with concerns for sinus infection; symptoms started almost 10 days ago with cold symptoms; now complaining of facial pain/ pressure- teeth are "full"/ ears are hurting; concerned that it could aggravate her asthma- no chest pain or wheezing; takes Claritin daily along with BREO for management of her asthma.     Objective:  Vitals:   09/20/18 1025  BP: 130/72  Pulse: 82  Temp: 97.6 F (36.4  C)  TempSrc: Oral  SpO2: 98%  Weight: 160 lb 0.6 oz (72.6 kg)  Height: 5\' 6"  (1.676 m)    General: Well developed, well nourished, in no acute distress  Skin : Warm and dry.  Head: Normocephalic and atraumatic  Eyes: Sclera and conjunctiva clear; pupils round and reactive to light; extraocular movements intact  Ears: External normal; canals clear; tympanic membranes congested bilaterally Oropharynx: Pink, supple. No suspicious lesions  Neck: Supple without thyromegaly, adenopathy  Lungs: Respirations unlabored; clear to auscultation bilaterally without wheeze, rales, rhonchi  CVS exam: normal  rate and regular rhythm.  Neurologic: Alert and oriented; speech intact; face symmetrical; moves all extremities well; CNII-XII intact without focal deficit   Assessment:  1. Acute sinusitis, recurrence not specified, unspecified location     Plan:  Rx for Omnicef 300 mg bid x 10 days, Flonase NS; increase fluids, rest and follow-up worse, no better; Medication and allergy lists were updated/ reconciled per patient request today.   No follow-ups on file.  No orders of the defined types were placed in this encounter.   Requested Prescriptions   Signed Prescriptions Disp Refills  . fluticasone (FLONASE) 50 MCG/ACT nasal spray 16 g 6    Sig: Place 2 sprays into both nostrils daily.  . cefdinir (OMNICEF) 300 MG capsule 20 capsule 0    Sig: Take 1 capsule (300 mg total) by mouth 2 (two) times daily.

## 2018-11-03 ENCOUNTER — Other Ambulatory Visit: Payer: Medicare Other

## 2018-11-03 ENCOUNTER — Ambulatory Visit: Payer: Medicare Other | Admitting: Family

## 2018-11-03 ENCOUNTER — Encounter: Payer: Self-pay | Admitting: Family

## 2018-11-03 VITALS — BP 116/70 | HR 90 | Temp 97.8°F | Ht 66.0 in | Wt 160.1 lb

## 2018-11-03 DIAGNOSIS — R300 Dysuria: Secondary | ICD-10-CM

## 2018-11-03 LAB — POC URINALSYSI DIPSTICK (AUTOMATED)
BILIRUBIN UA: NEGATIVE
Blood, UA: NEGATIVE
GLUCOSE UA: NEGATIVE
Ketones, UA: NEGATIVE
LEUKOCYTES UA: NEGATIVE
NITRITE UA: NEGATIVE
Protein, UA: NEGATIVE
Spec Grav, UA: 1.01 (ref 1.010–1.025)
Urobilinogen, UA: 0.2 E.U./dL
pH, UA: 6.5 (ref 5.0–8.0)

## 2018-11-03 MED ORDER — NITROFURANTOIN MONOHYD MACRO 100 MG PO CAPS: 100.0000 mg | ORAL_CAPSULE | Freq: Two times a day (BID) | ORAL | 0 refills | Status: DC

## 2018-11-03 NOTE — Patient Instructions (Signed)
Atrophic Vaginitis    Atrophic vaginitis is a condition in which the tissues that line the vagina become dry and thin. This condition is most common in women who have stopped having regular menstrual periods (are in menopause). This usually starts when a woman is 45-69 years old. That is the time when a woman's estrogen levels begin to drop (decrease).  Estrogen is a female hormone. It helps to keep the tissues of the vagina moist. It stimulates the vagina to produce a clear fluid that lubricates the vagina for sexual intercourse. This fluid also protects the vagina from infection. Lack of estrogen can cause the lining of the vagina to get thinner and dryer. The vagina may also shrink in size. It may become less elastic. Atrophic vaginitis tends to get worse over time as a woman's estrogen level drops.  What are the causes?  This condition is caused by the normal drop in estrogen that happens around the time of menopause.  What increases the risk?  Certain conditions or situations may lower a woman's estrogen level, leading to a higher risk for atrophic vaginitis. You are more likely to develop this condition if:   You are taking medicines that block estrogen.   You have had your ovaries removed.   You are being treated for cancer with X-ray (radiation) or medicines (chemotherapy).   You have given birth or are breastfeeding.   You are older than age 50.   You smoke.  What are the signs or symptoms?  Symptoms of this condition include:   Pain, soreness, or bleeding during sexual intercourse (dyspareunia).   Vaginal burning, irritation, or itching.   Pain or bleeding when a speculum is used in a vaginal exam (pelvic exam).   Having burning pain when passing urine.   Vaginal discharge that is brown or yellow.  In some cases, there are no symptoms.  How is this diagnosed?  This condition is diagnosed by taking a medical history and doing a physical exam. This will include a pelvic exam that checks the  vaginal tissues. Though rare, you may also have other tests, including:   A urine test.   A test that checks the acid balance in your vagina (acid balance test).  How is this treated?  Treatment for this condition depends on how severe your symptoms are. Treatment may include:   Using an over-the-counter vaginal lubricant before sex.   Using a long-acting vaginal moisturizer.   Using low-dose vaginal estrogen for moderate to severe symptoms that do not respond to other treatments. Options include creams, tablets, and inserts (vaginal rings). Before you use a vaginal estrogen, tell your health care provider if you have a history of:  ? Breast cancer.  ? Endometrial cancer.  ? Blood clots.  If you are not sexually active and your symptoms are very mild, you may not need treatment.  Follow these instructions at home:  Medicines   Take over-the-counter and prescription medicines only as told by your health care provider. Do not use herbal or alternative medicines unless your health care provider says that you can.   Use over-the-counter creams, lubricants, or moisturizers for dryness only as directed by your health care provider.  General instructions   If your atrophic vaginitis is caused by menopause, discuss all of your menopause symptoms and treatment options with your health care provider.   Do not douche.   Do not use products that can make your vagina dry. These include:  ? Scented feminine sprays.  ?   Scented tampons.  ? Scented soaps.   Vaginal intercourse can help to improve blood flow and elasticity of vaginal tissue. If it hurts to have sex, try using a lubricant or moisturizer just before having intercourse.  Contact a health care provider if:   Your discharge looks different than normal.   Your vagina has an unusual smell.   You have new symptoms.   Your symptoms do not improve with treatment.   Your symptoms get worse.  Summary   Atrophic vaginitis is a condition in which the tissues that  line the vagina become dry and thin. It is most common in women who have stopped having regular menstrual periods (are in menopause).   Treatment options include using vaginal lubricants and low-dose vaginal estrogen.   Contact a health care provider if your vagina has an unusual smell, or if your symptoms get worse or do not improve after treatment.  This information is not intended to replace advice given to you by your health care provider. Make sure you discuss any questions you have with your health care provider.  Document Released: 01/09/2015 Document Revised: 05/21/2017 Document Reviewed: 05/21/2017  Elsevier Interactive Patient Education  2019 [employer]

## 2018-11-03 NOTE — Addendum Note (Signed)
Addended by: Karma Ganja on: 11/03/2018 02:51 PM   Modules accepted: Orders

## 2018-11-03 NOTE — Progress Notes (Signed)
[name] [name] is a 68 y.o. female with the following history as recorded in [employer]:  Patient Active Problem List   Diagnosis Date Noted  . Laryngitis 07/22/2018  . Finger laceration 07/13/2018  . Hypercholesterolemia 11/16/2017  . Osteopenia 11/16/2017  . Thoracic back pain 11/14/2014  . Seromucinous otitis media 07/03/2011  . HTN (hypertension) 07/03/2011  . Well adult exam 02/27/2011  . CONSTIPATION, CHRONIC 10/28/2010  . NAUSEA 10/28/2010  . ABDOMINAL PAIN, EPIGASTRIC 10/28/2010  . BRONCHITIS, ACUTE 10/16/2009  . UPPER RESPIRATORY INFECTION (URI) 07/21/2008  . VERTIGO 07/21/2008  . CONTACT DERMATITIS&OTHER ECZEMA DUE UNSPEC CAUSE 06/15/2008  . OTHER SPECIFIED PRURITIC CONDITIONS 06/15/2008  . INSOMNIA 05/08/2008  . BURN OF UNSPECIFIED DEGREE OF PALM OF HAND 05/08/2008  . CERVICALGIA 03/30/2008  . HEMORRHOIDS 11/03/2007  . ESOPHAGEAL STRICTURE 11/03/2007  . OSTEOARTHRITIS 11/03/2007  . HEADACHE 11/03/2007  . ROTATOR CUFF SPRAIN AND STRAIN 11/03/2007  . Unspecified otitis media 10/08/2007  . ANEMIA-NOS 10/04/2007  . ALLERGIC RHINITIS 10/04/2007  . GERD 08/31/2007  . Dyslipidemia 07/27/2007  . CAROTID ARTERY STENOSIS 07/27/2007  . Asthma 07/27/2007  . OSTEOPENIA 07/27/2007    Current Outpatient Medications  Medication Sig Dispense Refill  . Albuterol Sulfate (PROAIR RESPICLICK) 108 (90 Base) MCG/ACT AEPB Inhale 2 puffs into the lungs as needed.    Marland Kitchen atorvastatin (LIPITOR) 10 MG tablet Take 1 tablet (10 mg total) by mouth daily. 90 tablet 3  . BREO ELLIPTA 200-25 MCG/INH AEPB Inhale 1 puff into the lungs daily.  6  . cholecalciferol (VITAMIN D) 1000 UNITS tablet Take 1,000 Units by mouth daily.      . famotidine (PEPCID) 40 MG tablet Take 1 tablet (40 mg total) by mouth daily. 90 tablet 3  . fluticasone (FLONASE) 50 MCG/ACT nasal spray Place 2 sprays into both nostrils daily. 16 g 6  . loratadine (CLARITIN) 10 MG tablet Take 10 mg by mouth daily.      Marland Kitchen losartan  (COZAAR) 50 MG tablet Take 1 tablet (50 mg total) by mouth daily. 90 tablet 3   No current facility-administered medications for this visit.     Allergies: Alendronate sodium and Amoxicillin-pot clavulanate  Past Medical History:  Diagnosis Date  . Allergic rhinitis   . Anemia    NOS  . Asthma   . Carotid stenosis    mild  . GERD (gastroesophageal reflux disease)    Dr. Marina Goodell  . Hyperlipidemia   . Migraine   . Osteopenia     Past Surgical History:  Procedure Laterality Date  . TUBAL LIGATION      Family History  Problem Relation Age of Onset  . Cancer Father 6       Chest  . Hypertension Mother     Social History   Tobacco Use  . Smoking status: Never Smoker  . Smokeless tobacco: Never Used  Substance Use Topics  . Alcohol use: No    Subjective:  Patient presents with concerns for possible UTI; + pressure with urination; no blood in urine; no fever; no back pain; not usually prone to UTIs; denies any vaginal discharge;     Objective:  Vitals:   11/03/18 1307  BP: 116/70  Pulse: 90  Temp: 97.8 F (36.6 C)  TempSrc: Oral  SpO2: 97%  Weight: 160 lb 1.3 oz (72.6 kg)  Height: 5\' 6"  (1.676 m)    General: Well developed, well nourished, in no acute distress  Skin : Warm and dry.  Head: Normocephalic and atraumatic  Eyes: Sclera and conjunctiva clear; pupils round and reactive to light; extraocular movements intact  Ears: External normal; canals clear; tympanic membranes normal  Oropharynx: Pink, supple. No suspicious lesions  Neck: Supple without thyromegaly, adenopathy  Lungs: Respirations unlabored; clear to auscultation bilaterally without wheeze, rales, rhonchi  hepatosplenomegaly  Musculoskeletal: No deformities; no active joint inflammation  Neurologic: Alert and oriented; speech intact; face symmetrical; moves all extremities well; CNII-XII intact without focal deficit   Assessment:  1. Dysuria     Plan:  ? UTI vs atrophic vaginitis; check U/A  and urine culture; Rx for Macrobid 100 mg bid x 7 days; ? Need for topical estrogen;   No follow-ups on file.  Orders Placed This Encounter  Procedures  . Urine Culture    Standing Status:   Future    Standing Expiration Date:   11/03/2019    Requested Prescriptions    No prescriptions requested or ordered in this encounter

## 2018-11-04 LAB — URINE CULTURE
MICRO NUMBER:: 245357
SPECIMEN QUALITY:: ADEQUATE

## 2018-11-09 ENCOUNTER — Encounter: Payer: Self-pay | Admitting: Family

## 2019-02-21 ENCOUNTER — Other Ambulatory Visit: Payer: Self-pay | Admitting: Internal Medicine

## 2019-02-25 ENCOUNTER — Encounter: Payer: Medicare Other | Admitting: Internal Medicine

## 2019-03-01 ENCOUNTER — Encounter: Payer: Medicare Other | Admitting: Internal Medicine

## 2019-04-11 ENCOUNTER — Other Ambulatory Visit (INDEPENDENT_AMBULATORY_CARE_PROVIDER_SITE_OTHER): Payer: Medicare Other

## 2019-04-11 ENCOUNTER — Other Ambulatory Visit: Payer: Self-pay

## 2019-04-11 ENCOUNTER — Encounter: Payer: Self-pay | Admitting: Internal Medicine

## 2019-04-11 ENCOUNTER — Ambulatory Visit (INDEPENDENT_AMBULATORY_CARE_PROVIDER_SITE_OTHER): Payer: Medicare Other | Admitting: Internal Medicine

## 2019-04-11 VITALS — BP 140/80 | HR 83 | Temp 97.7°F | Ht 66.0 in | Wt 158.0 lb

## 2019-04-11 DIAGNOSIS — I10 Essential (primary) hypertension: Secondary | ICD-10-CM

## 2019-04-11 DIAGNOSIS — K219 Gastro-esophageal reflux disease without esophagitis: Secondary | ICD-10-CM | POA: Diagnosis not present

## 2019-04-11 DIAGNOSIS — Z0000 Encounter for general adult medical examination without abnormal findings: Secondary | ICD-10-CM

## 2019-04-11 DIAGNOSIS — J4520 Mild intermittent asthma, uncomplicated: Secondary | ICD-10-CM | POA: Diagnosis not present

## 2019-04-11 DIAGNOSIS — R202 Paresthesia of skin: Secondary | ICD-10-CM

## 2019-04-11 DIAGNOSIS — Z23 Encounter for immunization: Secondary | ICD-10-CM | POA: Diagnosis not present

## 2019-04-11 DIAGNOSIS — E785 Hyperlipidemia, unspecified: Secondary | ICD-10-CM

## 2019-04-11 LAB — BASIC METABOLIC PANEL
BUN: 13 mg/dL (ref 6–23)
CO2: 24 mEq/L (ref 19–32)
Calcium: 9.5 mg/dL (ref 8.4–10.5)
Chloride: 105 mEq/L (ref 96–112)
Creatinine, Ser: 0.64 mg/dL (ref 0.40–1.20)
GFR: 91.99 mL/min (ref >60.00)
Glucose, Bld: 95 mg/dL (ref 70–99)
Potassium: 3.8 mEq/L (ref 3.5–5.1)
Sodium: 140 mEq/L (ref 135–145)

## 2019-04-11 LAB — CBC WITH DIFFERENTIAL/PLATELET
Basophils Absolute: 0.1 10*3/uL (ref 0.0–0.1)
Basophils Relative: 1 % (ref 0.0–3.0)
Eosinophils Absolute: 0 10*3/uL (ref 0.0–0.7)
Eosinophils Relative: 0.7 % (ref 0.0–5.0)
HCT: 42 % (ref 36.0–46.0)
Hemoglobin: 14.2 g/dL (ref 12.0–15.0)
Lymphocytes Relative: 28.6 % (ref 12.0–46.0)
Lymphs Abs: 1.8 10*3/uL (ref 0.7–4.0)
MCHC: 33.8 g/dL (ref 30.0–36.0)
MCV: 87.2 fl (ref 78.0–100.0)
Monocytes Absolute: 0.4 10*3/uL (ref 0.1–1.0)
Monocytes Relative: 6.4 % (ref 3.0–12.0)
Neutro Abs: 4 10*3/uL (ref 1.4–7.7)
Neutrophils Relative %: 63.3 % (ref 43.0–77.0)
Platelets: 331 10*3/uL (ref 150.0–400.0)
RBC: 4.82 Mil/uL (ref 3.87–5.11)
RDW: 14.2 % (ref 11.5–15.5)
WBC: 6.3 10*3/uL (ref 4.0–10.5)

## 2019-04-11 LAB — URINALYSIS
Bilirubin Urine: NEGATIVE
Ketones, ur: NEGATIVE
Leukocytes,Ua: NEGATIVE
Nitrite: NEGATIVE
Specific Gravity, Urine: <=1.005 — AB (ref 1.000–1.030)
Total Protein, Urine: NEGATIVE
Urine Glucose: NEGATIVE
Urobilinogen, UA: 0.2 (ref 0.0–1.0)
pH: 7 (ref 5.0–8.0)

## 2019-04-11 LAB — HEPATIC FUNCTION PANEL
ALT: 13 U/L (ref 0–35)
AST: 16 U/L (ref 0–37)
Albumin: 4.4 g/dL (ref 3.5–5.2)
Alkaline Phosphatase: 49 U/L (ref 39–117)
Bilirubin, Direct: 0.1 mg/dL (ref 0.0–0.3)
Total Bilirubin: 0.6 mg/dL (ref 0.2–1.2)
Total Protein: 7.4 g/dL (ref 6.0–8.3)

## 2019-04-11 LAB — LIPID PANEL
Cholesterol: 202 mg/dL — ABNORMAL HIGH (ref 0–200)
HDL: 62.4 mg/dL (ref >39.00)
LDL Cholesterol: 116 mg/dL — ABNORMAL HIGH (ref 0–99)
NonHDL: 139.1
Total CHOL/HDL Ratio: 3
Triglycerides: 116 mg/dL (ref 0.0–149.0)
VLDL: 23.2 mg/dL (ref 0.0–40.0)

## 2019-04-11 LAB — VITAMIN B12: Vitamin B-12: 237 pg/mL (ref 211–911)

## 2019-04-11 LAB — TSH: TSH: 1.52 u[IU]/mL (ref 0.35–4.50)

## 2019-04-11 MED ORDER — ATORVASTATIN CALCIUM 10 MG PO TABS: 10.0000 mg | ORAL_TABLET | Freq: Every day | ORAL | 3 refills | Status: DC

## 2019-04-11 MED ORDER — FAMOTIDINE 40 MG PO TABS: 40.0000 mg | ORAL_TABLET | Freq: Every day | ORAL | 3 refills | Status: DC

## 2019-04-11 NOTE — Assessment & Plan Note (Signed)
Pepcid?

## 2019-04-11 NOTE — Assessment & Plan Note (Signed)
Pneumovax 

## 2019-04-11 NOTE — Addendum Note (Signed)
Addended by: Cresenciano Lick on: 04/11/2019 09:02 AM   Modules accepted: Orders

## 2019-04-11 NOTE — Patient Instructions (Addendum)
If you have medicare related insurance (such as traditional Medicare, Blue Cross Medicare, United [employer] Medicare, or similar), Please make an appointment at the scheduling desk with Jill, the Wellness Health Coach, for your Wellness visit in this office, which is a benefit with your insurance.    Cardiac CT calcium scoring test $150   Computed tomography, more commonly known as a CT or CAT scan, is a diagnostic medical imaging test. Like traditional x-rays, it produces multiple images or pictures of the inside of the body. The cross-sectional images generated during a CT scan can be reformatted in multiple planes. They can even generate three-dimensional images. These images can be viewed on a computer monitor, printed on film or by a 3D printer, or transferred to a CD or DVD. CT images of internal organs, bones, soft tissue and blood vessels provide greater detail than traditional x-rays, particularly of soft tissues and blood vessels. A cardiac CT scan for coronary calcium is a non-invasive way of obtaining information about the presence, location and extent of calcified plaque in the coronary arteries-the vessels that supply oxygen-containing blood to the heart muscle. Calcified plaque results when there is a build-up of fat and other substances under the inner layer of the artery. This material can calcify which signals the presence of atherosclerosis, a disease of the vessel wall, also called coronary artery disease (CAD). People with this disease have an increased risk for heart attacks. In addition, over time, progression of plaque build up (CAD) can narrow the arteries or even close off blood flow to the heart. The result may be chest pain, sometimes called "angina," or a heart attack. Because calcium is a marker of CAD, the amount of calcium detected on a cardiac CT scan is a helpful prognostic tool. The findings on cardiac CT are expressed as a calcium score. Another name for this test is  coronary artery calcium scoring.  What are some common uses of the procedure? The goal of cardiac CT scan for calcium scoring is to determine if CAD is present and to what extent, even if there are no symptoms. It is a screening study that may be recommended by a physician for patients with risk factors for CAD but no clinical symptoms. The major risk factors for CAD are: . high blood cholesterol levels  . family history of heart attacks  . diabetes  . high blood pressure  . cigarette smoking  . overweight or obese  . physical inactivity   A negative cardiac CT scan for calcium scoring shows no calcification within the coronary arteries. This suggests that CAD is absent or so minimal it cannot be seen by this technique. The chance of having a heart attack over the next two to five years is very low under these circumstances. A positive test means that CAD is present, regardless of whether or not the patient is experiencing any symptoms. The amount of calcification-expressed as the calcium score-may help to predict the likelihood of a myocardial infarction (heart attack) in the coming years and helps your medical doctor or cardiologist decide whether the patient may need to take preventive medicine or undertake other measures such as diet and exercise to lower the risk for heart attack. The extent of CAD is graded according to your calcium score:  Calcium Score  Presence of CAD (coronary artery disease)  0 No evidence of CAD   1-10 Minimal evidence of CAD  11-100 Mild evidence of CAD  101-400 Moderate evidence of CAD  Over   400 Extensive evidence of CAD    

## 2019-04-11 NOTE — Assessment & Plan Note (Signed)
A cardiac CT scan for calcium scoring offered 

## 2019-04-11 NOTE — Assessment & Plan Note (Signed)
Losartan 

## 2019-04-11 NOTE — Assessment & Plan Note (Addendum)
We discussed age appropriate health related issues, including available/recomended screening tests and vaccinations. We discussed a need for adhering to healthy diet and exercise. Labs were ordered to be later reviewed . All questions were answered.  GYN - Dr Carlis Abbott, Mammo q 12 mo  Colon due in 2020 Dr Henrene Pastor Cologuard 2017

## 2019-04-11 NOTE — Progress Notes (Signed)
Subjective:  Patient ID: [name] [name], female    DOB: 28-Jan-1951  Age: 68 y.o. MRN: [id number]  CC: No chief complaint on file.   HPI [name] [name] presents for a well exam  Outpatient Medications Prior to Visit  Medication Sig Dispense Refill  . Albuterol Sulfate (PROAIR RESPICLICK) 108 (90 Base) MCG/ACT AEPB Inhale 2 puffs into the lungs as needed.    Marland Kitchen. BREO ELLIPTA 200-25 MCG/INH AEPB Inhale 1 puff into the lungs daily.  6  . cholecalciferol (VITAMIN D) 1000 UNITS tablet Take 1,000 Units by mouth daily.      . fluticasone (FLONASE) 50 MCG/ACT nasal spray Place 2 sprays into both nostrils daily. 16 g 6  . loratadine (CLARITIN) 10 MG tablet Take 10 mg by mouth daily.      Marland Kitchen. losartan (COZAAR) 50 MG tablet TAKE 1 TABLET BY MOUTH DAILY. 90 tablet 3  . atorvastatin (LIPITOR) 10 MG tablet Take 1 tablet (10 mg total) by mouth daily. 90 tablet 3  . famotidine (PEPCID) 40 MG tablet Take 1 tablet (40 mg total) by mouth daily. 90 tablet 3  . nitrofurantoin, macrocrystal-monohydrate, (MACROBID) 100 MG capsule Take 1 capsule (100 mg total) by mouth 2 (two) times daily. 14 capsule 0   No facility-administered medications prior to visit.     ROS: Review of Systems  Constitutional: Negative for activity change, appetite change, chills, fatigue and unexpected weight change.  HENT: Negative for congestion, mouth sores and sinus pressure.   Eyes: Negative for visual disturbance.  Respiratory: Negative for cough and chest tightness.   Gastrointestinal: Negative for abdominal pain and nausea.  Genitourinary: Negative for difficulty urinating, frequency and vaginal pain.  Musculoskeletal: Negative for back pain and gait problem.  Skin: Negative for pallor and rash.  Neurological: Negative for dizziness, tremors, weakness, numbness and headaches.  Psychiatric/Behavioral: Negative for confusion, sleep disturbance and suicidal ideas.    Objective:  BP 140/80 (BP Location: Left Arm,  Patient Position: Sitting, Cuff Size: Normal)   Pulse 83   Temp 97.7 F (36.5 C) (Oral)   Ht 5\' 6"  (1.676 m)   Wt 158 lb (71.7 kg)   SpO2 98%   BMI 25.50 kg/m   BP Readings from Last 3 Encounters:  04/11/19 140/80  11/03/18 116/70  09/20/18 130/72    Wt Readings from Last 3 Encounters:  04/11/19 158 lb (71.7 kg)  11/03/18 160 lb 1.3 oz (72.6 kg)  09/20/18 160 lb 0.6 oz (72.6 kg)    Physical Exam Constitutional:      General: She is not in acute distress.    Appearance: She is well-developed.  HENT:     Head: Normocephalic.     Right Ear: External ear normal.     Left Ear: External ear normal.     Nose: Nose normal.  Eyes:     General:        Right eye: No discharge.        Left eye: No discharge.     Conjunctiva/sclera: Conjunctivae normal.     Pupils: Pupils are equal, round, and reactive to light.  Neck:     Musculoskeletal: Normal range of motion and neck supple.     Thyroid: No thyromegaly.     Vascular: No JVD.     Trachea: No tracheal deviation.  Cardiovascular:     Rate and Rhythm: Normal rate and regular rhythm.     Heart sounds: Normal heart sounds.  Pulmonary:     Effort:  No respiratory distress.     Breath sounds: No stridor. No wheezing.  Abdominal:     General: Bowel sounds are normal. There is no distension.     Palpations: Abdomen is soft. There is no mass.     Tenderness: There is no abdominal tenderness. There is no guarding or rebound.  Musculoskeletal:        General: No tenderness.  Lymphadenopathy:     Cervical: No cervical adenopathy.  Skin:    Findings: No erythema or rash.  Neurological:     Cranial Nerves: No cranial nerve deficit.     Motor: No abnormal muscle tone.     Coordination: Coordination normal.     Deep Tendon Reflexes: Reflexes normal.  Psychiatric:        Behavior: Behavior normal.        Thought Content: Thought content normal.        Judgment: Judgment normal.     Lab Results  Component Value Date   WBC  5.7 02/23/2018   HGB 14.0 02/23/2018   HCT 40.8 02/23/2018   PLT 310.0 02/23/2018   GLUCOSE 95 02/23/2018   CHOL 195 02/23/2018   TRIG 85.0 02/23/2018   HDL 69.80 02/23/2018   LDLCALC 109 (H) 02/23/2018   ALT 16 02/23/2018   AST 16 02/23/2018   NA 142 02/23/2018   K 4.3 02/23/2018   CL 107 02/23/2018   CREATININE 0.69 02/23/2018   BUN 11 02/23/2018   CO2 27 02/23/2018   TSH 1.31 02/23/2018    Mm 3d Screen Breast Bilateral  Result Date: 07/14/2018 CLINICAL DATA:  Screening. EXAM: DIGITAL SCREENING BILATERAL MAMMOGRAM WITH TOMO AND CAD COMPARISON:  Previous exam(s). ACR Breast Density Category b: There are scattered areas of fibroglandular density. FINDINGS: There are no findings suspicious for malignancy. Images were processed with CAD. IMPRESSION: No mammographic evidence of malignancy. A result letter of this screening mammogram will be mailed directly to the patient. RECOMMENDATION: Screening mammogram in one year. (Code:SM-B-01Y) BI-RADS CATEGORY  1: Negative. Electronically Signed   By: Everlean Alstrom M.D.   On: 07/14/2018 07:39    Assessment & Plan:   There are no diagnoses linked to this encounter.   Meds ordered this encounter  Medications  . atorvastatin (LIPITOR) 10 MG tablet    Sig: Take 1 tablet (10 mg total) by mouth daily.    Dispense:  90 tablet    Refill:  3  . famotidine (PEPCID) 40 MG tablet    Sig: Take 1 tablet (40 mg total) by mouth daily.    Dispense:  90 tablet    Refill:  3     Follow-up: No follow-ups on file.  Walker Kehr, MD

## 2019-04-12 ENCOUNTER — Other Ambulatory Visit: Payer: Self-pay | Admitting: Internal Medicine

## 2019-04-12 MED ORDER — B COMPLEX PO TABS: 1.0000 | ORAL_TABLET | Freq: Every day | ORAL | 3 refills | Status: AC

## 2019-04-14 ENCOUNTER — Other Ambulatory Visit: Payer: Self-pay | Admitting: Internal Medicine

## 2019-04-14 DIAGNOSIS — E785 Hyperlipidemia, unspecified: Secondary | ICD-10-CM

## 2019-05-30 ENCOUNTER — Other Ambulatory Visit: Payer: Self-pay | Admitting: Internal Medicine

## 2019-05-30 DIAGNOSIS — Z1231 Encounter for screening mammogram for malignant neoplasm of breast: Secondary | ICD-10-CM

## 2019-06-09 ENCOUNTER — Ambulatory Visit (INDEPENDENT_AMBULATORY_CARE_PROVIDER_SITE_OTHER)
Admission: RE | Admit: 2019-06-09 | Discharge: 2019-06-09 | Disposition: A | Discharge status: Home / Self Care | Payer: Self-pay | Source: Ambulatory Visit | Attending: Internal Medicine | Admitting: Internal Medicine

## 2019-06-09 ENCOUNTER — Other Ambulatory Visit: Payer: Self-pay

## 2019-06-09 DIAGNOSIS — E785 Hyperlipidemia, unspecified: Secondary | ICD-10-CM

## 2019-06-10 ENCOUNTER — Other Ambulatory Visit: Payer: Self-pay | Admitting: Internal Medicine

## 2019-06-10 DIAGNOSIS — I2583 Coronary atherosclerosis due to lipid rich plaque: Secondary | ICD-10-CM | POA: Insufficient documentation

## 2019-06-10 DIAGNOSIS — I2510 Atherosclerotic heart disease of native coronary artery without angina pectoris: Secondary | ICD-10-CM | POA: Insufficient documentation

## 2019-06-10 DIAGNOSIS — E785 Hyperlipidemia, unspecified: Secondary | ICD-10-CM

## 2019-06-10 MED ORDER — ASPIRIN EC 81 MG PO TBEC: 81.0000 mg | DELAYED_RELEASE_TABLET | Freq: Every day | ORAL | 3 refills | Status: DC

## 2019-06-16 ENCOUNTER — Telehealth: Payer: Self-pay | Admitting: Cardiovascular Disease

## 2019-06-16 NOTE — Telephone Encounter (Signed)
LVM for patient to call and schedule new patient appt.  °

## 2019-06-23 ENCOUNTER — Ambulatory Visit (INDEPENDENT_AMBULATORY_CARE_PROVIDER_SITE_OTHER): Payer: Medicare Other

## 2019-06-23 ENCOUNTER — Other Ambulatory Visit: Payer: Self-pay

## 2019-06-23 DIAGNOSIS — Z23 Encounter for immunization: Secondary | ICD-10-CM | POA: Diagnosis not present

## 2019-06-30 DIAGNOSIS — R931 Abnormal findings on diagnostic imaging of heart and coronary circulation: Secondary | ICD-10-CM | POA: Insufficient documentation

## 2019-06-30 NOTE — Progress Notes (Signed)
Cardiology Office Note   Date:  07/01/2019   ID:  MARIJAYNE RAUTH, DOB [birth year], MRN [id number]  PCP:  Cassandria Anger, MD  Cardiologist:   Minus Breeding, MD Referring:  Cassandria Anger, MD   Chief Complaint  Patient presents with  . Abnormal ECG      History of Present Illness: [name] [name] is a 68 y.o. female who is referred by Plotnikov, Evie Lacks, MD for evaluation of elevated coronary calcium score.  This was obtained recently.  She had a calcium score of 89 which was 73rd percntile.    She has no past cardiac history.  She does do some walking for exercise.  She does have risk factors of dyslipidemia.  She is also managed for hypertension.  She has a left bundle branch block which is new on EKG.  She was sent for coronary calcium score and was found to have the score above.  She has not had any palpitations, presyncope or syncope.  She said no PND or orthopnea.  He said no weight gain or edema.  She has no new shortness of breath, PND or orthopnea.  She does not report chest pressure, neck or arm discomfort.  Past Medical History:  Diagnosis Date  . Allergic rhinitis   . Anemia    NOS  . Asthma   . Carotid stenosis    mild  . GERD (gastroesophageal reflux disease)    Dr. Henrene Pastor  . Hyperlipidemia   . Migraine   . Osteopenia     Past Surgical History:  Procedure Laterality Date  . TUBAL LIGATION       Current Outpatient Medications  Medication Sig Dispense Refill  . Albuterol Sulfate (PROAIR RESPICLICK) 423 (90 Base) MCG/ACT AEPB Inhale 2 puffs into the lungs as needed.    Marland Kitchen aspirin EC 81 MG tablet Take 1 tablet (81 mg total) by mouth daily. 100 tablet 3  . atorvastatin (LIPITOR) 10 MG tablet Take 1 tablet (10 mg total) by mouth daily. 90 tablet 3  . b complex vitamins tablet Take 1 tablet by mouth daily. 100 tablet 3  . BREO ELLIPTA 200-25 MCG/INH AEPB Inhale 1 puff into the lungs daily.  6  . cholecalciferol (VITAMIN D) 1000 UNITS  tablet Take 1,000 Units by mouth daily.      . famotidine (PEPCID) 40 MG tablet Take 1 tablet (40 mg total) by mouth daily. 90 tablet 3  . fluticasone (FLONASE) 50 MCG/ACT nasal spray Place 2 sprays into both nostrils daily. 16 g 6  . loratadine (CLARITIN) 10 MG tablet Take 10 mg by mouth daily.      Marland Kitchen losartan (COZAAR) 50 MG tablet TAKE 1 TABLET BY MOUTH DAILY. 90 tablet 3  . atorvastatin (LIPITOR) 20 MG tablet Take 1 tablet (20 mg total) by mouth daily. 90 tablet 3   No current facility-administered medications for this visit.     Allergies:   Alendronate sodium and Amoxicillin-pot clavulanate    Social History:  The patient  reports that she has never smoked. She has never used smokeless tobacco. She reports that she does not drink alcohol or use drugs.   Family History:  The patient's family history includes Cancer (age of onset: 37) in her father; Hypertension in her mother.    ROS:  Please see the history of present illness.   Otherwise, review of systems are positive for none.   All other systems are reviewed and negative.    PHYSICAL  EXAM: VS:  BP (!) 141/74   Pulse 73   Ht 5\' 6"  (1.676 m)   Wt 154 lb 6.4 oz (70 kg)   SpO2 99%   BMI 24.92 kg/m  , BMI Body mass index is 24.92 kg/m. GENERAL:  Well appearing HEENT:  Pupils equal round and reactive, fundi not visualized, oral mucosa unremarkable NECK:  No jugular venous distention, waveform within normal limits, carotid upstroke brisk and symmetric, no bruits, no thyromegaly LYMPHATICS:  No cervical, inguinal adenopathy LUNGS:  Clear to auscultation bilaterally BACK:  No CVA tenderness CHEST:  Unremarkable HEART:  PMI not displaced or sustained,S1 and S2 within normal limits, no S3, no S4, no clicks, no rubs, no murmurs ABD:  Flat, positive bowel sounds normal in frequency in pitch, no bruits, no rebound, no guarding, no midline pulsatile mass, no hepatomegaly, no splenomegaly EXT:  2 plus pulses throughout, no edema, no  cyanosis no clubbing SKIN:  No rashes no nodules NEURO:  Cranial nerves II through XII grossly intact, motor grossly intact throughout PSYCH:  Cognitively intact, oriented to person place and time    EKG:  EKG is ordered today. The ekg ordered today demonstrates sinus rhythm, rate 71, left bundle branch block, premature ectopic complexes.   Recent Labs: 04/11/2019: ALT 13; BUN 13; Creatinine, Ser 0.64; Hemoglobin 14.2; Platelets 331.0; Potassium 3.8; Sodium 140; TSH 1.52    Lipid Panel    Component Value Date/Time   CHOL 202 (H) 04/11/2019 0913   TRIG 116.0 04/11/2019 0913   HDL 62.40 04/11/2019 0913   CHOLHDL 3 04/11/2019 0913   VLDL 23.2 04/11/2019 0913   LDLCALC 116 (H) 04/11/2019 0913      Wt Readings from Last 3 Encounters:  07/01/19 154 lb 6.4 oz (70 kg)  04/11/19 158 lb (71.7 kg)  11/03/18 160 lb 1.3 oz (72.6 kg)      Other studies Reviewed: Additional studies/ records that were reviewed today include: Labs, calcium score. Review of the above records demonstrates:  Please see elsewhere in the note.     ASSESSMENT AND PLAN:  ELEVATED CORONARY CALCIUM:   She does have an elevated calcium score and a left bundle branch block.  I like to walk her on a treadmill but because of the EKG changes at baseline I could not do that.  Therefore, I will order a Lexiscan Myoview.  Of note I did calculate her MESA score and it was only 6.2 so I do not think that aspirin is indicated.  HTN: Blood pressure is controlled.  She will continue the meds as listed.  DYSLIPIDEMIA: LDL was 119 and I think the target should be less than 70.  I will increase her Lipitor to 40 mg daily.  She will get a fasting lipid and liver in about 10 weeks.  CAROTID STENOSIS:  This was mild in the past.  I will repeat carotid Dopplers  LBBB: This will be evaluated as above.  Current medicines are reviewed at length with the patient today.  The patient does not have concerns regarding medicines.  The  following changes have been made:  no change  Labs/ tests ordered today include:   Orders Placed This Encounter  Procedures  . Lipid panel  . Hepatic function panel  . MYOCARDIAL PERFUSION IMAGING  . EKG 12-Lead  . VAS 11/05/18 CAROTID     Disposition:   FU with me as needed    Signed, [place], MD  07/01/2019 12:55 PM    [facility]

## 2019-07-01 ENCOUNTER — Encounter: Payer: Self-pay | Admitting: Cardiology

## 2019-07-01 ENCOUNTER — Other Ambulatory Visit: Payer: Self-pay

## 2019-07-01 ENCOUNTER — Ambulatory Visit: Payer: Medicare Other | Admitting: Cardiology

## 2019-07-01 VITALS — BP 141/74 | HR 73 | Ht 66.0 in | Wt 154.4 lb

## 2019-07-01 DIAGNOSIS — R931 Abnormal findings on diagnostic imaging of heart and coronary circulation: Secondary | ICD-10-CM

## 2019-07-01 DIAGNOSIS — I10 Essential (primary) hypertension: Secondary | ICD-10-CM

## 2019-07-01 DIAGNOSIS — E785 Hyperlipidemia, unspecified: Secondary | ICD-10-CM

## 2019-07-01 DIAGNOSIS — R9431 Abnormal electrocardiogram [ECG] [EKG]: Secondary | ICD-10-CM

## 2019-07-01 DIAGNOSIS — I409 Acute myocarditis, unspecified: Secondary | ICD-10-CM

## 2019-07-01 MED ORDER — ATORVASTATIN CALCIUM 20 MG PO TABS: 20.0000 mg | ORAL_TABLET | Freq: Every day | ORAL | 3 refills | Status: DC

## 2019-07-01 NOTE — Patient Instructions (Signed)
Medication Instructions:  Starting taking 20mg  Lipitor Daily.   If you need a refill on your cardiac medications before your next appointment, please call your pharmacy.   Lab work: NONE  Testing/Procedures: Your physician has requested that you have a lexiscan myoview. For further information please visit [email or web address]. Please follow instruction sheet, as given.  Your physician has requested that you have a carotid duplex. This test is an ultrasound of the carotid arteries in your neck. It looks at blood flow through these arteries that supply the brain with blood. Allow one hour for this exam. There are no restrictions or special instructions.   Follow-Up: As needed.

## 2019-07-12 ENCOUNTER — Telehealth (HOSPITAL_COMMUNITY): Payer: Self-pay

## 2019-07-12 NOTE — Telephone Encounter (Signed)
Encounter complete. 

## 2019-07-13 ENCOUNTER — Ambulatory Visit (HOSPITAL_BASED_OUTPATIENT_CLINIC_OR_DEPARTMENT_OTHER)
Admission: RE | Admit: 2019-07-13 | Discharge: 2019-07-13 | Disposition: A | Discharge status: Home / Self Care | Payer: Medicare Other | Source: Ambulatory Visit | Attending: Cardiology | Admitting: Cardiology

## 2019-07-13 ENCOUNTER — Other Ambulatory Visit: Payer: Self-pay | Admitting: Cardiology

## 2019-07-13 ENCOUNTER — Ambulatory Visit (HOSPITAL_COMMUNITY)
Admission: RE | Admit: 2019-07-13 | Discharge: 2019-07-13 | Disposition: A | Discharge status: Home / Self Care | Payer: Medicare Other | Source: Ambulatory Visit | Attending: Cardiology | Admitting: Cardiology

## 2019-07-13 ENCOUNTER — Other Ambulatory Visit: Payer: Self-pay

## 2019-07-13 DIAGNOSIS — I409 Acute myocarditis, unspecified: Secondary | ICD-10-CM | POA: Diagnosis not present

## 2019-07-13 DIAGNOSIS — E785 Hyperlipidemia, unspecified: Secondary | ICD-10-CM | POA: Insufficient documentation

## 2019-07-13 DIAGNOSIS — I10 Essential (primary) hypertension: Secondary | ICD-10-CM | POA: Diagnosis present

## 2019-07-13 DIAGNOSIS — R0989 Other specified symptoms and signs involving the circulatory and respiratory systems: Secondary | ICD-10-CM

## 2019-07-13 DIAGNOSIS — R931 Abnormal findings on diagnostic imaging of heart and coronary circulation: Secondary | ICD-10-CM | POA: Diagnosis present

## 2019-07-13 DIAGNOSIS — R9431 Abnormal electrocardiogram [ECG] [EKG]: Secondary | ICD-10-CM

## 2019-07-13 LAB — MYOCARDIAL PERFUSION IMAGING
LV dias vol: 78 mL (ref 46–106)
LV sys vol: 28 mL
Peak HR: 114 {beats}/min
Rest HR: 71 {beats}/min
SDS: 9
SRS: 5
SSS: 14
TID: 1.06

## 2019-07-13 MED ORDER — TECHNETIUM TC 99M TETROFOSMIN IV KIT
10.7000 | PACK | Freq: Once | INTRAVENOUS | Status: AC | PRN
  Administered 2019-07-13: 10.7 via INTRAVENOUS
  Filled 2019-07-13: qty 11

## 2019-07-13 MED ORDER — TECHNETIUM TC 99M TETROFOSMIN IV KIT
32.3000 | PACK | Freq: Once | INTRAVENOUS | Status: AC | PRN
  Administered 2019-07-13: 32.3 via INTRAVENOUS
  Filled 2019-07-13: qty 33

## 2019-07-13 MED ORDER — REGADENOSON 0.4 MG/5ML IV SOLN
0.4000 mg | Freq: Once | INTRAVENOUS | Status: AC
  Administered 2019-07-13: 0.4 mg via INTRAVENOUS

## 2019-07-15 ENCOUNTER — Ambulatory Visit
Admission: RE | Admit: 2019-07-15 | Discharge: 2019-07-15 | Disposition: A | Discharge status: Home / Self Care | Payer: Medicare Other | Source: Ambulatory Visit | Attending: Internal Medicine | Admitting: Internal Medicine

## 2019-07-15 ENCOUNTER — Other Ambulatory Visit: Payer: Self-pay

## 2019-07-15 DIAGNOSIS — Z1231 Encounter for screening mammogram for malignant neoplasm of breast: Secondary | ICD-10-CM

## 2019-08-27 ENCOUNTER — Encounter: Payer: Self-pay | Admitting: Physician Assistant

## 2019-08-27 ENCOUNTER — Other Ambulatory Visit: Payer: Self-pay

## 2019-08-27 ENCOUNTER — Ambulatory Visit
Admission: EM | Admit: 2019-08-27 | Discharge: 2019-08-27 | Disposition: A | Discharge status: Home / Self Care | Payer: Medicare Other

## 2019-08-27 DIAGNOSIS — W19XXXA Unspecified fall, initial encounter: Secondary | ICD-10-CM

## 2019-08-27 DIAGNOSIS — H6981 Other specified disorders of Eustachian tube, right ear: Secondary | ICD-10-CM

## 2019-08-27 DIAGNOSIS — T148XXA Other injury of unspecified body region, initial encounter: Secondary | ICD-10-CM | POA: Diagnosis not present

## 2019-08-27 NOTE — Discharge Instructions (Signed)
No alarming signs on exam. Continue to monitor symptoms. Artificial tear gel (systane/genteal). Flonase and saline spray. Continue to monitor symptoms, if develop severe headache, nausea/vomiting, sensitivity to light, weakness/dizziness, confusion, go to the emergency department for further evaluation needed.

## 2019-08-27 NOTE — ED Triage Notes (Signed)
Triaged by provider  

## 2019-08-27 NOTE — ED Provider Notes (Addendum)
[facility]    CSN: [id number] Arrival date & time: 08/27/19  1050      History   Chief Complaint Chief Complaint  Patient presents with  . Fall    HPI [name] [name] is a 68 y.o. female.   68 year old female with history of carotid stenosis, HLD, asthma, HTN comes in for evaluation after fall yesterday.  States [place] [facility] [place] [facility] [place] [facility] [place] [facility]  Object broke, causing patient to fall forward.  States had impact to her glasses, which broke.  Fall caused her head to be in shallow puddle to the grass, and she worries for infection and therefore came in for evaluation.  She denies loss of consciousness.  Was able to ambulate on own after incident.  States had mild headache to the nasal area after fall yesterday, this has since resolved.  Denies any nausea, vomiting, dizziness, vision changes.  Denies weakness, syncope.  Denies confusion/altered mental status.  Denies photophobia.  Denies rhinorrhea, nasal congestion, cough.  Denies any body aches/joint pain.  Denies blood thinner use.     Past Medical History:  Diagnosis Date  . Allergic rhinitis   . Anemia    NOS  . Asthma   . Carotid stenosis    mild  . GERD (gastroesophageal reflux disease)    Dr. Marina GoodellPerry  . Hyperlipidemia   . Migraine   . Osteopenia     Patient Active Problem List   Diagnosis Date Noted  . Elevated coronary artery calcium score 06/30/2019  . Coronary atherosclerosis due to lipid rich plaque 06/10/2019  . Laryngitis 07/22/2018  . Finger laceration 07/13/2018  . Hypercholesterolemia 11/16/2017  . Osteopenia 11/16/2017  . Thoracic back pain 11/14/2014  . Seromucinous otitis media 07/03/2011  . HTN (hypertension) 07/03/2011  . Well adult exam 02/27/2011  . CONSTIPATION, CHRONIC 10/28/2010  . NAUSEA 10/28/2010  . ABDOMINAL PAIN, EPIGASTRIC 10/28/2010  . BRONCHITIS, ACUTE 10/16/2009  . UPPER RESPIRATORY INFECTION (URI) 07/21/2008  . VERTIGO 07/21/2008  .  CONTACT DERMATITIS&OTHER ECZEMA DUE UNSPEC CAUSE 06/15/2008  . OTHER SPECIFIED PRURITIC CONDITIONS 06/15/2008  . INSOMNIA 05/08/2008  . BURN OF UNSPECIFIED DEGREE OF PALM OF HAND 05/08/2008  . CERVICALGIA 03/30/2008  . HEMORRHOIDS 11/03/2007  . ESOPHAGEAL STRICTURE 11/03/2007  . OSTEOARTHRITIS 11/03/2007  . HEADACHE 11/03/2007  . ROTATOR CUFF SPRAIN AND STRAIN 11/03/2007  . Unspecified otitis media 10/08/2007  . ANEMIA-NOS 10/04/2007  . ALLERGIC RHINITIS 10/04/2007  . GERD 08/31/2007  . Dyslipidemia 07/27/2007  . CAROTID ARTERY STENOSIS 07/27/2007  . Asthma 07/27/2007  . OSTEOPENIA 07/27/2007    Past Surgical History:  Procedure Laterality Date  . TUBAL LIGATION      OB History   No obstetric history on file.      Home Medications    Prior to Admission medications   Medication Sig Start Date End Date Taking? Authorizing Provider  Albuterol Sulfate (PROAIR RESPICLICK) 108 (90 Base) MCG/ACT AEPB Inhale 2 puffs into the lungs as needed.    [provider]  atorvastatin (LIPITOR) 20 MG tablet Take 1 tablet (20 mg total) by mouth daily. 07/01/19 09/29/19  Rollene RotundaHochrein, James, MD  b complex vitamins tablet Take 1 tablet by mouth daily. 04/12/19   Plotnikov, Georgina QuintAleksei V, MD  BREO ELLIPTA 200-25 MCG/INH AEPB Inhale 1 puff into the lungs daily. 02/15/16   [provider]  cholecalciferol (VITAMIN D) 1000 UNITS tablet Take 1,000 Units by mouth daily.      [provider]  famotidine (  PEPCID) 40 MG tablet Take 1 tablet (40 mg total) by mouth daily. 04/11/19   Plotnikov, Georgina Quint, MD  fluticasone (FLONASE) 50 MCG/ACT nasal spray Place 2 sprays into both nostrils daily. 09/20/18   Olive Bass, FNP  loratadine (CLARITIN) 10 MG tablet Take 10 mg by mouth daily.      [provider]  losartan (COZAAR) 50 MG tablet TAKE 1 TABLET BY MOUTH DAILY. 02/22/19   Plotnikov, Georgina Quint, MD    Family History Family History  Problem Relation Age of Onset  . Cancer  Father 74       Chest  . Hypertension Mother     Social History Social History   Tobacco Use  . Smoking status: Never Smoker  . Smokeless tobacco: Never Used  Substance Use Topics  . Alcohol use: No  . Drug use: No     Allergies   Alendronate sodium and Amoxicillin-pot clavulanate   Review of Systems Review of Systems  Reason unable to perform ROS: See HPI as above.     Physical Exam Triage Vital Signs ED Triage Vitals  Enc Vitals Group     BP      Pulse      Resp      Temp      Temp src      SpO2      Weight      Height      Head Circumference      Peak Flow      Pain Score      Pain Loc      Pain Edu?      Excl. in GC?    No data found.  Updated Vital Signs BP 138/78   Pulse 78   Temp 97.9 F (36.6 C)   Resp 18   SpO2 98%   Physical Exam Constitutional:      General: She is not in acute distress.    Appearance: She is well-developed. She is not diaphoretic.  HENT:     Head: Normocephalic and atraumatic.     Right Ear: Ear canal and external ear normal. A middle ear effusion is present. Tympanic membrane is not erythematous or bulging.     Left Ear: Tympanic membrane, ear canal and external ear normal. Tympanic membrane is not erythematous or bulging.     Nose: No nasal deformity, congestion or rhinorrhea.     Right Nostril: No epistaxis or septal hematoma.     Left Nostril: No epistaxis or septal hematoma.     Right Sinus: No maxillary sinus tenderness or frontal sinus tenderness.     Left Sinus: No maxillary sinus tenderness or frontal sinus tenderness.     Comments: Abrasion to the bridge of nose from glasses. No bleeding. No tenderness to palpation.     Mouth/Throat:     Mouth: Mucous membranes are moist.     Pharynx: Oropharynx is clear. Uvula midline.  Eyes:     Extraocular Movements: Extraocular movements intact.     Conjunctiva/sclera: Conjunctivae normal.     Pupils: Pupils are equal, round, and reactive to light.  Cardiovascular:      Rate and Rhythm: Normal rate and regular rhythm.  Pulmonary:     Effort: Pulmonary effort is normal. No accessory muscle usage or respiratory distress.     Breath sounds: Normal breath sounds. No stridor. No decreased breath sounds, wheezing, rhonchi or rales.  Musculoskeletal:     Cervical back: Normal range of motion and  neck supple. No pain with movement, spinous process tenderness or muscular tenderness.  Skin:    General: Skin is warm and dry.  Neurological:     Mental Status: She is alert and oriented to person, place, and time.     GCS: GCS eye subscore is 4. GCS verbal subscore is 5. GCS motor subscore is 6.     Cranial Nerves: Cranial nerves are intact.     Sensory: Sensation is intact.     Motor: Motor function is intact.     Coordination: Coordination is intact.     Gait: Gait is intact.      UC Treatments / Results  Labs (all labs ordered are listed, but only abnormal results are displayed) Labs Reviewed - No data to display  EKG   Radiology No results found.  Procedures Procedures (including critical care time)  Medications Ordered in UC Medications - No data to display  Initial Impression / Assessment and Plan / UC Course  I have reviewed the triage vital signs and the nursing notes.  Pertinent labs & imaging results that were available during my care of the patient were reviewed by me and considered in my medical decision making (see chart for details).    Discussed given age >3 with head injury, could be concerns intracranial bleed. However, fall yesterday with patient currently asymptomatic. Neurology exam grossly intact. Okay to monitor for now closely with strict return precautions. Otherwise, exam without signs of infection and to continue to monitor as well.  Patient expresses understanding and agrees to plan.  Final Clinical Impressions(s) / UC Diagnoses   Final diagnoses:  Dysfunction of right eustachian tube  Skin abrasion  Fall, initial  encounter    ED Prescriptions    None     PDMP not reviewed this encounter.   Ok Edwards, PA-C 08/27/19 [place], Dinnis Rog V, PA-C 08/27/19 1333

## 2019-09-06 LAB — HEPATIC FUNCTION PANEL
ALT: 17 IU/L (ref 0–32)
AST: 22 IU/L (ref 0–40)
Albumin: 4.4 g/dL (ref 3.8–4.8)
Alkaline Phosphatase: 53 IU/L (ref 39–117)
Bilirubin Total: 0.4 mg/dL (ref 0.0–1.2)
Bilirubin, Direct: 0.12 mg/dL (ref 0.00–0.40)
Total Protein: 6.8 g/dL (ref 6.0–8.5)

## 2019-09-06 LAB — LIPID PANEL
Chol/HDL Ratio: 2.3 ratio (ref 0.0–4.4)
Cholesterol, Total: 164 mg/dL (ref 100–199)
HDL: 70 mg/dL (ref >39)
LDL Chol Calc (NIH): 76 mg/dL (ref 0–99)
Triglycerides: 97 mg/dL (ref 0–149)
VLDL Cholesterol Cal: 18 mg/dL (ref 5–40)

## 2019-09-07 ENCOUNTER — Other Ambulatory Visit: Payer: Self-pay

## 2019-09-07 DIAGNOSIS — Z79899 Other long term (current) drug therapy: Secondary | ICD-10-CM

## 2019-09-07 DIAGNOSIS — I10 Essential (primary) hypertension: Secondary | ICD-10-CM

## 2019-09-07 DIAGNOSIS — E785 Hyperlipidemia, unspecified: Secondary | ICD-10-CM

## 2019-09-07 DIAGNOSIS — I2510 Atherosclerotic heart disease of native coronary artery without angina pectoris: Secondary | ICD-10-CM

## 2019-09-08 ENCOUNTER — Other Ambulatory Visit: Payer: Self-pay

## 2019-09-08 DIAGNOSIS — E785 Hyperlipidemia, unspecified: Secondary | ICD-10-CM

## 2019-09-08 MED ORDER — ATORVASTATIN CALCIUM 40 MG PO TABS: 40.0000 mg | ORAL_TABLET | Freq: Every day | ORAL | 3 refills | Status: DC

## 2019-09-20 ENCOUNTER — Other Ambulatory Visit: Payer: Self-pay | Admitting: Internal Medicine

## 2019-11-25 DIAGNOSIS — Z01419 Encounter for gynecological examination (general) (routine) without abnormal findings: Secondary | ICD-10-CM | POA: Diagnosis not present

## 2020-02-08 ENCOUNTER — Other Ambulatory Visit: Payer: Self-pay | Admitting: Internal Medicine

## 2020-03-01 ENCOUNTER — Ambulatory Visit (INDEPENDENT_AMBULATORY_CARE_PROVIDER_SITE_OTHER): Payer: Medicare PPO

## 2020-03-01 DIAGNOSIS — Z0000 Encounter for general adult medical examination without abnormal findings: Secondary | ICD-10-CM

## 2020-03-01 NOTE — Patient Instructions (Signed)
Ms. [name] [name] , Thank you for taking time to come for your Medicare Wellness Visit. I appreciate your ongoing commitment to your health goals. Please review the following plan we discussed and let me know if I can assist you in the future.   Screening recommendations/referrals: Colonoscopy: last done 03/29/2016 (Cologuard-due 2021) Mammogram: last done 07/15/2019 Bone Density: last done 02/23/2018 (due every 2-3 years) Recommended yearly ophthalmology/optometry visit for glaucoma screening and checkup Recommended yearly dental visit for hygiene and checkup  Vaccinations: Influenza vaccine: 06/23/2019 Pneumococcal vaccine: completed Tdap vaccine: 02/20/2015; due every 10 years Shingles vaccine: never done   Covid-19: never done Zoster vaccine: 03/04/2015  Advanced directives: Please bring a copy of your health care power of attorney and living will to the office at your convenience.  Conditions/risks identified: Please continue to do your personal lifestyle choices by: daily care of teeth and gums, regular physical activity (goal should be 5 days a week for 30 minutes), eat a healthy diet, avoid tobacco and drug use, limiting any alcohol intake, taking a low-dose aspirin (if not allergic or have been advised by your provider otherwise) and taking vitamins and minerals as recommended by your provider. Continue doing brain stimulating activities (puzzles, reading, adult coloring books, staying active) to keep memory sharp. Continue to eat heart healthy diet (full of fruits, vegetables, whole grains, lean protein, water--limit salt, fat, and sugar intake) and increase physical activity as tolerated.   Next appointment: Please schedule your next Medicare Wellness Visit with your Nurse Health Advisor in 1 year.   Preventive Care 46 Years and Older, Female Preventive care refers to lifestyle choices and visits with your health care provider that can promote health and wellness. What does preventive  care include?  A yearly physical exam. This is also called an annual well check.  Dental exams once or twice a year.  Routine eye exams. Ask your health care provider how often you should have your eyes checked.  Personal lifestyle choices, including:  Daily care of your teeth and gums.  Regular physical activity.  Eating a healthy diet.  Avoiding tobacco and drug use.  Limiting alcohol use.  Practicing safe sex.  Taking low-dose aspirin every day.  Taking vitamin and mineral supplements as recommended by your health care provider. What happens during an annual well check? The services and screenings done by your health care provider during your annual well check will depend on your age, overall health, lifestyle risk factors, and family history of disease. Counseling  Your health care provider may ask you questions about your:  Alcohol use.  Tobacco use.  Drug use.  Emotional well-being.  Home and relationship well-being.  Sexual activity.  Eating habits.  History of falls.  Memory and ability to understand (cognition).  Work and work Statistician.  Reproductive health. Screening  You may have the following tests or measurements:  Height, weight, and BMI.  Blood pressure.  Lipid and cholesterol levels. These may be checked every 5 years, or more frequently if you are over 54 years old.  Skin check.  Lung cancer screening. You may have this screening every year starting at age 29 if you have a 30-pack-year history of smoking and currently smoke or have quit within the past 15 years.  Fecal occult blood test (FOBT) of the stool. You may have this test every year starting at age 46.  Flexible sigmoidoscopy or colonoscopy. You may have a sigmoidoscopy every 5 years or a colonoscopy every 10 years starting at age  50.  Hepatitis C blood test.  Hepatitis B blood test.  Sexually transmitted disease (STD) testing.  Diabetes screening. This is done by  checking your blood sugar (glucose) after you have not eaten for a while (fasting). You may have this done every 1-3 years.  Bone density scan. This is done to screen for osteoporosis. You may have this done starting at age 4.  Mammogram. This may be done every 1-2 years. Talk to your health care provider about how often you should have regular mammograms. Talk with your health care provider about your test results, treatment options, and if necessary, the need for more tests. Vaccines  Your health care provider may recommend certain vaccines, such as:  Influenza vaccine. This is recommended every year.  Tetanus, diphtheria, and acellular pertussis (Tdap, Td) vaccine. You may need a Td booster every 10 years.  Zoster vaccine. You may need this after age 58.  Pneumococcal 13-valent conjugate (PCV13) vaccine. One dose is recommended after age 41.  Pneumococcal polysaccharide (PPSV23) vaccine. One dose is recommended after age 46. Talk to your health care provider about which screenings and vaccines you need and how often you need them. This information is not intended to replace advice given to you by your health care provider. Make sure you discuss any questions you have with your health care provider. Document Released: 09/21/2015 Document Revised: 05/14/2016 Document Reviewed: 06/26/2015 Elsevier Interactive Patient Education  2017 [employer].  Fall Prevention in the Home Falls can cause injuries. They can happen to people of all ages. There are many things you can do to make your home safe and to help prevent falls. What can I do on the outside of my home?  Regularly fix the edges of walkways and driveways and fix any cracks.  Remove anything that might make you trip as you walk through a door, such as a raised step or threshold.  Trim any bushes or trees on the path to your home.  Use bright outdoor lighting.  Clear any walking paths of anything that might make someone trip,  such as rocks or tools.  Regularly check to see if handrails are loose or broken. Make sure that both sides of any steps have handrails.  Any raised decks and porches should have guardrails on the edges.  Have any leaves, snow, or ice cleared regularly.  Use sand or salt on walking paths during winter.  Clean up any spills in your garage right away. This includes oil or grease spills. What can I do in the bathroom?  Use night lights.  Install grab bars by the toilet and in the tub and shower. Do not use towel bars as grab bars.  Use non-skid mats or decals in the tub or shower.  If you need to sit down in the shower, use a plastic, non-slip stool.  Keep the floor dry. Clean up any water that spills on the floor as soon as it happens.  Remove soap buildup in the tub or shower regularly.  Attach bath mats securely with double-sided non-slip rug tape.  Do not have throw rugs and other things on the floor that can make you trip. What can I do in the bedroom?  Use night lights.  Make sure that you have a light by your bed that is easy to reach.  Do not use any sheets or blankets that are too big for your bed. They should not hang down onto the floor.  Have a firm chair that  has side arms. You can use this for support while you get dressed.  Do not have throw rugs and other things on the floor that can make you trip. What can I do in the kitchen?  Clean up any spills right away.  Avoid walking on wet floors.  Keep items that you use a lot in easy-to-reach places.  If you need to reach something above you, use a strong step stool that has a grab bar.  Keep electrical cords out of the way.  Do not use floor polish or wax that makes floors slippery. If you must use wax, use non-skid floor wax.  Do not have throw rugs and other things on the floor that can make you trip. What can I do with my stairs?  Do not leave any items on the stairs.  Make sure that there are  handrails on both sides of the stairs and use them. Fix handrails that are broken or loose. Make sure that handrails are as long as the stairways.  Check any carpeting to make sure that it is firmly attached to the stairs. Fix any carpet that is loose or worn.  Avoid having throw rugs at the top or bottom of the stairs. If you do have throw rugs, attach them to the floor with carpet tape.  Make sure that you have a light switch at the top of the stairs and the bottom of the stairs. If you do not have them, ask someone to add them for you. What else can I do to help prevent falls?  Wear shoes that:  Do not have high heels.  Have rubber bottoms.  Are comfortable and fit you well.  Are closed at the toe. Do not wear sandals.  If you use a stepladder:  Make sure that it is fully opened. Do not climb a closed stepladder.  Make sure that both sides of the stepladder are locked into place.  Ask someone to hold it for you, if possible.  Clearly mark and make sure that you can see:  Any grab bars or handrails.  First and last steps.  Where the edge of each step is.  Use tools that help you move around (mobility aids) if they are needed. These include:  Canes.  Walkers.  Scooters.  Crutches.  Turn on the lights when you go into a dark area. Replace any light bulbs as soon as they burn out.  Set up your furniture so you have a clear path. Avoid moving your furniture around.  If any of your floors are uneven, fix them.  If there are any pets around you, be aware of where they are.  Review your medicines with your doctor. Some medicines can make you feel dizzy. This can increase your chance of falling. Ask your doctor what other things that you can do to help prevent falls. This information is not intended to replace advice given to you by your health care provider. Make sure you discuss any questions you have with your health care provider. Document Released: 06/21/2009  Document Revised: 01/31/2016 Document Reviewed: 09/29/2014 Elsevier Interactive Patient Education  2017 [employer].

## 2020-03-01 NOTE — Progress Notes (Signed)
I connected with [name] [name]. [name] [name] today by telephone and verified that I am speaking with the correct person using two identifiers. Location patient: home Location provider: work Persons participating in the virtual visit: Lauralee Waters. Sant[place]evan and Susie Cassette, LPN   I discussed the limitations, risks, security and privacy concerns of performing an evaluation and management service by telephone and the availability of in person appointments. I also discussed with the patient that there may be a patient responsible charge related to this service. The patient expressed understanding and verbally consented to this telephonic visit.    Interactive audio and video telecommunications were attempted between this provider and patient, however failed, due to patient having technical difficulties OR patient did not have access to video capability.  We continued and completed visit with audio only.  Some vital signs may be absent or patient reported.   Time Spent with patient on telephone encounter: 30 minutes  Subjective:   [name] [name] is a 69 y.o. female who presents for Medicare Annual (Subsequent) preventive examination.  Review of Systems    No ROS. Medicare Wellness Visit Cardiac Risk Factors include: advanced age (>22men, >73 women);dyslipidemia;hypertension     Objective:    There were no vitals filed for this visit. There is no height or weight on file to calculate BMI.  Advanced Directives 03/01/2020  Does Patient Have a Medical Advance Directive? Yes  Type of Estate agent of [place];Living will  Does patient want to make changes to medical advance directive? No - Patient declined  Copy of Healthcare Power of Attorney in Chart? No - copy requested    Current Medications (verified) Outpatient Encounter Medications as of 03/01/2020  Medication Sig  . Albuterol Sulfate (PROAIR RESPICLICK) 108 (90 Base) MCG/ACT AEPB Inhale 2 puffs into the  lungs as needed.  Marland Kitchen atorvastatin (LIPITOR) 10 MG tablet TAKE 1 TABLET BY MOUTH DAILY.  Marland Kitchen b complex vitamins tablet Take 1 tablet by mouth daily.  Marland Kitchen BREO ELLIPTA 200-25 MCG/INH AEPB Inhale 1 puff into the lungs daily.  . cholecalciferol (VITAMIN D) 1000 UNITS tablet Take 1,000 Units by mouth daily.    . famotidine (PEPCID) 40 MG tablet Take 1 tablet (40 mg total) by mouth daily.  . fluticasone (FLONASE) 50 MCG/ACT nasal spray Place 2 sprays into both nostrils daily.  Marland Kitchen loratadine (CLARITIN) 10 MG tablet Take 10 mg by mouth daily.    Marland Kitchen losartan (COZAAR) 50 MG tablet Take 1 tablet (50 mg total) by mouth daily. Annual appt due in August must see provider for future refills  . atorvastatin (LIPITOR) 40 MG tablet Take 1 tablet (40 mg total) by mouth daily.   No facility-administered encounter medications on file as of 03/01/2020.    Allergies (verified) Alendronate sodium and Amoxicillin-pot clavulanate   History: Past Medical History:  Diagnosis Date  . Allergic rhinitis   . Anemia    NOS  . Asthma   . Carotid stenosis    mild  . GERD (gastroesophageal reflux disease)    Dr. Marina Goodell  . Hyperlipidemia   . Migraine   . Osteopenia    Past Surgical History:  Procedure Laterality Date  . TUBAL LIGATION     Family History  Problem Relation Age of Onset  . Cancer Father 64       Chest  . Hypertension Mother    Social History   Socioeconomic History  . Marital status: Married    Spouse name: Not on file  .  Number of children: Not on file  . Years of education: Not on file  . Highest education level: Not on file  Occupational History  . Occupation: Retired  Tobacco Use  . Smoking status: Never Smoker  . Smokeless tobacco: Never Used  Substance and Sexual Activity  . Alcohol use: No  . Drug use: No  . Sexual activity: Yes  Other Topics Concern  . Not on file  Social History Narrative   Married.  Two children.  Six grands.    Social Determinants of Health   Financial  Resource Strain:   . Difficulty of Paying Living Expenses:   Food Insecurity:   . Worried About Programme researcher, broadcasting/film/video in the Last Year:   . Barista in the Last Year:   Transportation Needs:   . Freight forwarder (Medical):   Marland Kitchen Lack of Transportation (Non-Medical):   Physical Activity:   . Days of Exercise per Week:   . Minutes of Exercise per Session:   Stress:   . Feeling of Stress :   Social Connections:   . Frequency of Communication with Friends and Family:   . Frequency of Social Gatherings with Friends and Family:   . Attends Religious Services:   . Active Member of Clubs or Organizations:   . Attends Banker Meetings:   Marland Kitchen Marital Status:     Tobacco Counseling Counseling given: Not Answered   Clinical Intake:  Pre-visit preparation completed: Yes  Pain : No/denies pain     Nutritional Risks: None Diabetes: No  How often do you need to have someone help you when you read instructions, pamphlets, or other written materials from your doctor or pharmacy?: 1 - Never What is the last grade level you completed in school?: Bachelor's Degree in Education  Diabetic? No  Interpreter Needed?: No  Information entered by :: Jaylyn Booher N. Kristofor Michalowski, LPN   Activities of Daily Living In your present state of health, do you have any difficulty performing the following activities: 03/01/2020  Hearing? N  Vision? N  Difficulty concentrating or making decisions? N  Walking or climbing stairs? N  Dressing or bathing? N  Doing errands, shopping? N  Preparing Food and eating ? N  Using the Toilet? N  In the past six months, have you accidently leaked urine? N  Do you have problems with loss of bowel control? N  Managing your Medications? N  Managing your Finances? N  Housekeeping or managing your Housekeeping? N  Some recent data might be hidden    Patient Care Team: Plotnikov, Georgina Quint, MD as PCP - General Rollene Rotunda, MD as PCP - Cardiology  (Cardiology) Donzetta Starch, MD (Dermatology) Hilarie Fredrickson, MD (Gastroenterology) Sidney Ace, MD (Allergy) Jethro Bolus, MD as Attending Physician (Ophthalmology) Marlow Baars, MD as Consulting Physician (Obstetrics)  Indicate any recent Medical Services you may have received from other than [facility] providers in the past year (date may be approximate).     Assessment:   This is a routine wellness examination for [name] [name].  Hearing/Vision screen No exam data present  Dietary issues and exercise activities discussed: Current Exercise Habits: Home exercise routine, Type of exercise: walking, Time (Minutes): 30, Frequency (Times/Week): 5, Weekly Exercise (Minutes/Week): 150, Intensity: Moderate, Exercise limited by: respiratory conditions(s)  Goals    .  Client understands the importance of follow-up with providers by attending scheduled visits    .  Patient Stated (pt-stated)      To maintain  my current health status by continuing to eat healthy and stay physically active & socially active.      Depression Screen PHQ 2/9 Scores 03/01/2020 02/23/2018 02/17/2017 02/28/2016  PHQ - 2 Score 0 0 0 0    Fall Risk Fall Risk  03/01/2020 02/23/2018 02/17/2017 02/28/2016  Falls in the past year? 1 No No No  Number falls in past yr: 0 - - -  Injury with Fall? 0 - - -  Risk for fall due to : No Fall Risks - - -  Follow up Falls evaluation completed - - -    Any stairs in or around the home? Yes  If so, are there any without handrails? No  Home free of loose throw rugs in walkways, pet beds, electrical cords, etc? Yes  Adequate lighting in your home to reduce risk of falls? Yes   ASSISTIVE DEVICES UTILIZED TO PREVENT FALLS:  Life alert? No  Use of a cane, walker or w/c? No  Grab bars in the bathroom? Yes  Shower chair or bench in shower? Yes  Elevated toilet seat or a handicapped toilet? No   TIMED UP AND GO:  Was the test performed? No .  Length of time to ambulate 10 feet: 0 sec.    Gait steady and fast without use of assistive device (per patient)  Cognitive Function:     6CIT Screen 03/01/2020  What Year? 0 points  What month? 0 points  What time? 0 points  Count back from 20 0 points  Months in reverse 0 points  Repeat phrase 0 points  Total Score 0    Immunizations Immunization History  Administered Date(s) Administered  . Fluad Quad(high Dose 65+) 06/23/2019  . Influenza Split 06/24/2011, 07/06/2012  . Influenza Whole 06/23/2008, 06/07/2009, 05/27/2010  . Influenza, High Dose Seasonal PF 06/14/2015, 06/19/2016, 06/23/2017, 06/23/2018  . Influenza,inj,Quad PF,6+ Mos 06/08/2013, 06/13/2014  . Influenza-Unspecified 06/08/2013  . Pneumococcal Conjugate-13 02/28/2016  . Pneumococcal Polysaccharide-23 02/27/2011, 04/11/2019  . Pneumococcal-Unspecified 02/07/2011  . Td 09/12/2005  . Tdap 02/20/2015  . Zoster 03/07/2015    TDAP status: Up to date Flu Vaccine status: Up to date Pneumococcal vaccine status: Up to date Covid-19 vaccine status: Declined, Education has been provided regarding the importance of this vaccine but patient still declined. Advised may receive this vaccine at local pharmacy or Health Dept.or vaccine clinic. Aware to provide a copy of the vaccination record if obtained from local pharmacy or Health Dept. Verbalized acceptance and understanding.  Qualifies for Shingles Vaccine? Yes   Zostavax completed Yes   Shingrix Completed?: No.    Education has been provided regarding the importance of this vaccine. Patient has been advised to call insurance company to determine out of pocket expense if they have not yet received this vaccine. Advised may also receive vaccine at local pharmacy or Health Dept. Verbalized acceptance and understanding.  Screening Tests Health Maintenance  Topic Date Due  . COVID-19 Vaccine (1) Never done  . Fecal DNA (Cologuard)  03/30/2019  . INFLUENZA VACCINE  04/08/2020  . MAMMOGRAM  07/14/2021  .  TETANUS/TDAP  02/19/2025  . DEXA SCAN  Completed  . Hepatitis C Screening  Completed  . PNA vac Low Risk Adult  Completed    Health Maintenance  Health Maintenance Due  Topic Date Due  . COVID-19 Vaccine (1) Never done  . Fecal DNA (Cologuard)  03/30/2019    Colorectal cancer screening: Completed 03/29/2016. Repeat every 3 years Mammogram status: Completed 07/15/2019. Repeat  every year Bone Density status: Completed 02/23/2018. Results reflect: Bone density results: OSTEOPOROSIS. Repeat every 2 years.  Lung Cancer Screening: (Low Dose CT Chest recommended if Age 79-80 years, 30 pack-year currently smoking OR have quit w/in 15years.) does not qualify.   Lung Cancer Screening Referral:  No  Additional Screening:  Hepatitis C Screening: does not qualify; Completed Yes  Vision Screening: Recommended annual ophthalmology exams for early detection of glaucoma and other disorders of the eye. Is the patient up to date with their annual eye exam?  Yes  Who is the provider or what is the name of the office in which the patient attends annual eye exams? Rutherford Guys, MD If pt is not established with a provider, would they like to be referred to a provider to establish care? No .   Dental Screening: Recommended annual dental exams for proper oral hygiene  Community Resource Referral / Chronic Care Management: CRR required this visit?  No   CCM required this visit?  No      Plan:     I have personally reviewed and noted the following in the patient's chart:   . Medical and social history . Use of alcohol, tobacco or illicit drugs  . Current medications and supplements . Functional ability and status . Nutritional status . Physical activity . Advanced directives . List of other physicians . Hospitalizations, surgeries, and ER visits in previous 12 months . Vitals . Screenings to include cognitive, depression, and falls . Referrals and appointments  In addition, I have  reviewed and discussed with patient certain preventive protocols, quality metrics, and best practice recommendations. A written personalized care plan for preventive services as well as general preventive health recommendations were provided to patient.     Sheral Flow, LPN   05/02/538   Nurse Notes:  There were no vitals filed for this visit. There is no height or weight on file to calculate BMI.

## 2020-03-20 DIAGNOSIS — L814 Other melanin hyperpigmentation: Secondary | ICD-10-CM | POA: Diagnosis not present

## 2020-03-20 DIAGNOSIS — D1801 Hemangioma of skin and subcutaneous tissue: Secondary | ICD-10-CM | POA: Diagnosis not present

## 2020-03-20 DIAGNOSIS — D2372 Other benign neoplasm of skin of left lower limb, including hip: Secondary | ICD-10-CM | POA: Diagnosis not present

## 2020-03-20 DIAGNOSIS — L718 Other rosacea: Secondary | ICD-10-CM | POA: Diagnosis not present

## 2020-03-20 DIAGNOSIS — L821 Other seborrheic keratosis: Secondary | ICD-10-CM | POA: Diagnosis not present

## 2020-03-20 DIAGNOSIS — Z85828 Personal history of other malignant neoplasm of skin: Secondary | ICD-10-CM | POA: Diagnosis not present

## 2020-03-23 DIAGNOSIS — J300 Vasomotor rhinitis: Secondary | ICD-10-CM | POA: Diagnosis not present

## 2020-03-23 DIAGNOSIS — J4530 Mild persistent asthma, uncomplicated: Secondary | ICD-10-CM | POA: Diagnosis not present

## 2020-04-17 ENCOUNTER — Other Ambulatory Visit: Payer: Self-pay

## 2020-04-17 ENCOUNTER — Ambulatory Visit (INDEPENDENT_AMBULATORY_CARE_PROVIDER_SITE_OTHER): Payer: Medicare PPO | Admitting: Internal Medicine

## 2020-04-17 ENCOUNTER — Encounter: Payer: Self-pay | Admitting: Internal Medicine

## 2020-04-17 VITALS — BP 122/70 | HR 76 | Temp 98.3°F | Ht 66.0 in | Wt 157.2 lb

## 2020-04-17 DIAGNOSIS — I2583 Coronary atherosclerosis due to lipid rich plaque: Secondary | ICD-10-CM | POA: Diagnosis not present

## 2020-04-17 DIAGNOSIS — I2510 Atherosclerotic heart disease of native coronary artery without angina pectoris: Secondary | ICD-10-CM

## 2020-04-17 DIAGNOSIS — Z0000 Encounter for general adult medical examination without abnormal findings: Secondary | ICD-10-CM | POA: Diagnosis not present

## 2020-04-17 DIAGNOSIS — R931 Abnormal findings on diagnostic imaging of heart and coronary circulation: Secondary | ICD-10-CM

## 2020-04-17 DIAGNOSIS — Z1211 Encounter for screening for malignant neoplasm of colon: Secondary | ICD-10-CM

## 2020-04-17 DIAGNOSIS — I10 Essential (primary) hypertension: Secondary | ICD-10-CM

## 2020-04-17 DIAGNOSIS — E785 Hyperlipidemia, unspecified: Secondary | ICD-10-CM

## 2020-04-17 MED ORDER — FAMOTIDINE 40 MG PO TABS: 40.0000 mg | ORAL_TABLET | Freq: Every day | ORAL | 3 refills | Status: DC

## 2020-04-17 MED ORDER — VITAMIN D3 50 MCG (2000 UT) PO CAPS: 2000.0000 [IU] | ORAL_CAPSULE | Freq: Every day | ORAL | 3 refills | Status: AC

## 2020-04-17 MED ORDER — LOSARTAN POTASSIUM 50 MG PO TABS: 50.0000 mg | ORAL_TABLET | Freq: Every day | ORAL | 3 refills | Status: DC

## 2020-04-17 MED ORDER — ATORVASTATIN CALCIUM 10 MG PO TABS: 10.0000 mg | ORAL_TABLET | Freq: Every day | ORAL | 3 refills | Status: DC

## 2020-04-17 NOTE — Assessment & Plan Note (Signed)
  A cardiac CT scan for calcium score is 89 in 9/20. Coronary arteries: Calcium noted in LM and proximal LAD On Lipitor, no need for ASA

## 2020-04-17 NOTE — Assessment & Plan Note (Signed)
Dr Antoine Poche A cardiac CT scan for calcium score is 89 in 9/20. Coronary arteries: Calcium noted in LM and proximal LAD On Lipitor, no need for ASA

## 2020-04-17 NOTE — Assessment & Plan Note (Addendum)
  We discussed age appropriate health related issues, including available/recomended screening tests and vaccinations. We discussed a need for adhering to healthy diet and exercise. Labs were ordered to be later reviewed . All questions were answered.  GYN - Dr Chestine Spore, Mammo q 12 mo  Colon due in 2020 Dr Marina Goodell Cologuard 2017 due 2021 A  cardiac CT scan for calcium scoring done COVID 19 vaccine discussed, the patient declined.  She was asked to reconsider.

## 2020-04-17 NOTE — Assessment & Plan Note (Signed)
Dr Hochrein A cardiac CT scan for calcium score is 89 in 9/20. Coronary arteries: Calcium noted in LM and proximal LAD On Lipitor, no need for ASA 

## 2020-04-17 NOTE — Progress Notes (Signed)
Subjective:  Patient ID: [name] [name], female    DOB: 07/07/1951  Age: 69 y.o. MRN: [id number]  CC: Annual Exam   HPI [name] [name] presents for a well exam  F/u dyslipidemia Questions re LBBB, alendronate  Outpatient Medications Prior to Visit  Medication Sig Dispense Refill  . albuterol (VENTOLIN HFA) 108 (90 Base) MCG/ACT inhaler Inhale 1-2 puffs into the lungs every 6 (six) hours as needed for wheezing or shortness of breath.    Marland Kitchen b complex vitamins tablet Take 1 tablet by mouth daily. 100 tablet 3  . BREO ELLIPTA 200-25 MCG/INH AEPB Inhale 1 puff into the lungs daily.  6  . cholecalciferol (VITAMIN D) 1000 UNITS tablet Take 1,000 Units by mouth daily.      . fluticasone (FLONASE) 50 MCG/ACT nasal spray Place 2 sprays into both nostrils daily. 16 g 6  . loratadine (CLARITIN) 10 MG tablet Take 10 mg by mouth daily.      Marland Kitchen atorvastatin (LIPITOR) 10 MG tablet TAKE 1 TABLET BY MOUTH DAILY. 90 tablet 2  . famotidine (PEPCID) 40 MG tablet Take 1 tablet (40 mg total) by mouth daily. 90 tablet 3  . losartan (COZAAR) 50 MG tablet Take 1 tablet (50 mg total) by mouth daily. Annual appt due in August must see provider for future refills 90 tablet 0  . Albuterol Sulfate (PROAIR RESPICLICK) 108 (90 Base) MCG/ACT AEPB Inhale 2 puffs into the lungs as needed. (Patient not taking: Reported on 04/17/2020)    . atorvastatin (LIPITOR) 40 MG tablet Take 1 tablet (40 mg total) by mouth daily. 90 tablet 3   No facility-administered medications prior to visit.    ROS: Review of Systems  Constitutional: Negative for activity change, appetite change, chills, fatigue and unexpected weight change.  HENT: Negative for congestion, mouth sores and sinus pressure.   Eyes: Negative for visual disturbance.  Respiratory: Negative for cough and chest tightness.   Gastrointestinal: Negative for abdominal pain and nausea.  Genitourinary: Negative for difficulty urinating, frequency and  vaginal pain.  Musculoskeletal: Negative for back pain and gait problem.  Skin: Negative for pallor and rash.  Neurological: Negative for dizziness, tremors, weakness, numbness and headaches.  Psychiatric/Behavioral: Negative for confusion and sleep disturbance.    Objective:  BP 122/70 (BP Location: Left Arm)   Pulse 76   Temp 98.3 F (36.8 C) (Oral)   Ht 5\' 6"  (1.676 m)   Wt 157 lb 3.2 oz (71.3 kg)   SpO2 96%   BMI 25.37 kg/m   BP Readings from Last 3 Encounters:  04/17/20 122/70  08/27/19 138/78  07/01/19 (!) 141/74    Wt Readings from Last 3 Encounters:  04/17/20 157 lb 3.2 oz (71.3 kg)  07/13/19 154 lb (69.9 kg)  07/01/19 154 lb 6.4 oz (70 kg)    Physical Exam Constitutional:      General: She is not in acute distress.    Appearance: She is well-developed.  HENT:     Head: Normocephalic.     Right Ear: External ear normal.     Left Ear: External ear normal.     Nose: Nose normal.  Eyes:     General:        Right eye: No discharge.        Left eye: No discharge.     Conjunctiva/sclera: Conjunctivae normal.     Pupils: Pupils are equal, round, and reactive to light.  Neck:     Thyroid: No thyromegaly.  Vascular: No JVD.     Trachea: No tracheal deviation.  Cardiovascular:     Rate and Rhythm: Normal rate and regular rhythm.     Heart sounds: Normal heart sounds.  Pulmonary:     Effort: No respiratory distress.     Breath sounds: No stridor. No wheezing.  Abdominal:     General: Bowel sounds are normal. There is no distension.     Palpations: Abdomen is soft. There is no mass.     Tenderness: There is no abdominal tenderness. There is no guarding or rebound.  Musculoskeletal:        General: No tenderness.     Cervical back: Normal range of motion and neck supple.  Lymphadenopathy:     Cervical: No cervical adenopathy.  Skin:    Findings: No erythema or rash.  Neurological:     Mental Status: She is oriented to person, place, and time.      Cranial Nerves: No cranial nerve deficit.     Motor: No abnormal muscle tone.     Coordination: Coordination normal.     Deep Tendon Reflexes: Reflexes normal.  Psychiatric:        Behavior: Behavior normal.        Thought Content: Thought content normal.        Judgment: Judgment normal.     Lab Results  Component Value Date   WBC 6.3 04/11/2019   HGB 14.2 04/11/2019   HCT 42.0 04/11/2019   PLT 331.0 04/11/2019   GLUCOSE 95 04/11/2019   CHOL 164 09/06/2019   TRIG 97 09/06/2019   HDL 70 09/06/2019   LDLCALC 76 09/06/2019   ALT 17 09/06/2019   AST 22 09/06/2019   NA 140 04/11/2019   K 3.8 04/11/2019   CL 105 04/11/2019   CREATININE 0.64 04/11/2019   BUN 13 04/11/2019   CO2 24 04/11/2019   TSH 1.52 04/11/2019    No results found.  Assessment & Plan:   There are no diagnoses linked to this encounter.   Meds ordered this encounter  Medications  . atorvastatin (LIPITOR) 10 MG tablet    Sig: Take 1 tablet (10 mg total) by mouth daily.    Dispense:  90 tablet    Refill:  3  . famotidine (PEPCID) 40 MG tablet    Sig: Take 1 tablet (40 mg total) by mouth daily.    Dispense:  90 tablet    Refill:  3  . losartan (COZAAR) 50 MG tablet    Sig: Take 1 tablet (50 mg total) by mouth daily.    Dispense:  90 tablet    Refill:  3     Follow-up: No follow-ups on file.   Sonda Primes, MD

## 2020-04-17 NOTE — Assessment & Plan Note (Signed)
Losartan 

## 2020-04-18 LAB — COMPLETE METABOLIC PANEL WITH GFR
AG Ratio: 1.7 (calc) (ref 1.0–2.5)
ALT: 18 U/L (ref 6–29)
AST: 19 U/L (ref 10–35)
Albumin: 4.5 g/dL (ref 3.6–5.1)
Alkaline phosphatase (APISO): 48 U/L (ref 37–153)
BUN: 13 mg/dL (ref 7–25)
CO2: 26 mmol/L (ref 20–32)
Calcium: 9.5 mg/dL (ref 8.6–10.4)
Chloride: 105 mmol/L (ref 98–110)
Creat: 0.73 mg/dL (ref 0.60–0.93)
GFR, Est African American: 97 mL/min/{1.73_m2} (ref >=60)
GFR, Est Non African American: 83 mL/min/{1.73_m2} (ref >=60)
Globulin: 2.6 g/dL (calc) (ref 1.9–3.7)
Glucose, Bld: 95 mg/dL (ref 65–99)
Potassium: 4.4 mmol/L (ref 3.5–5.3)
Sodium: 141 mmol/L (ref 135–146)
Total Bilirubin: 0.6 mg/dL (ref 0.2–1.2)
Total Protein: 7.1 g/dL (ref 6.1–8.1)

## 2020-04-18 LAB — URINALYSIS
Bilirubin Urine: NEGATIVE
Glucose, UA: NEGATIVE
Hgb urine dipstick: NEGATIVE
Ketones, ur: NEGATIVE
Leukocytes,Ua: NEGATIVE
Nitrite: NEGATIVE
Protein, ur: NEGATIVE
Specific Gravity, Urine: 1.003 (ref 1.001–1.03)
pH: 7 (ref 5.0–8.0)

## 2020-04-18 LAB — LIPID PANEL
Cholesterol: 202 mg/dL — ABNORMAL HIGH (ref <200)
HDL: 73 mg/dL (ref >=50)
LDL Cholesterol (Calc): 109 mg/dL (calc) — ABNORMAL HIGH
Non-HDL Cholesterol (Calc): 129 mg/dL (calc) (ref <130)
Total CHOL/HDL Ratio: 2.8 (calc) (ref <5.0)
Triglycerides: 103 mg/dL (ref <150)

## 2020-04-18 LAB — CBC WITH DIFFERENTIAL/PLATELET
Absolute Monocytes: 479 cells/uL (ref 200–950)
Basophils Absolute: 69 cells/uL (ref 0–200)
Basophils Relative: 1.1 %
Eosinophils Absolute: 38 cells/uL (ref 15–500)
Eosinophils Relative: 0.6 %
HCT: 42.2 % (ref 35.0–45.0)
Hemoglobin: 14.1 g/dL (ref 11.7–15.5)
Lymphs Abs: 1531 cells/uL (ref 850–3900)
MCH: 29.7 pg (ref 27.0–33.0)
MCHC: 33.4 g/dL (ref 32.0–36.0)
MCV: 89 fL (ref 80.0–100.0)
MPV: 10.6 fL (ref 7.5–12.5)
Monocytes Relative: 7.6 %
Neutro Abs: 4183 cells/uL (ref 1500–7800)
Neutrophils Relative %: 66.4 %
Platelets: 321 10*3/uL (ref 140–400)
RBC: 4.74 10*6/uL (ref 3.80–5.10)
RDW: 12.8 % (ref 11.0–15.0)
Total Lymphocyte: 24.3 %
WBC: 6.3 10*3/uL (ref 3.8–10.8)

## 2020-04-18 LAB — TSH: TSH: 1.66 mIU/L (ref 0.40–4.50)

## 2020-04-22 ENCOUNTER — Other Ambulatory Visit: Payer: Self-pay | Admitting: Internal Medicine

## 2020-04-22 MED ORDER — ATORVASTATIN CALCIUM 20 MG PO TABS: 20.0000 mg | ORAL_TABLET | Freq: Every day | ORAL | 3 refills | Status: DC

## 2020-05-01 ENCOUNTER — Other Ambulatory Visit: Payer: Self-pay | Admitting: Internal Medicine

## 2020-05-01 DIAGNOSIS — Z1231 Encounter for screening mammogram for malignant neoplasm of breast: Secondary | ICD-10-CM

## 2020-05-04 DIAGNOSIS — Z1211 Encounter for screening for malignant neoplasm of colon: Secondary | ICD-10-CM | POA: Diagnosis not present

## 2020-05-04 LAB — COLOGUARD: Cologuard: NEGATIVE

## 2020-05-16 LAB — EXTERNAL GENERIC LAB PROCEDURE: COLOGUARD: NEGATIVE

## 2020-05-16 LAB — COLOGUARD: COLOGUARD: NEGATIVE

## 2020-06-01 ENCOUNTER — Encounter: Payer: Self-pay | Admitting: Internal Medicine

## 2020-07-06 ENCOUNTER — Ambulatory Visit (INDEPENDENT_AMBULATORY_CARE_PROVIDER_SITE_OTHER): Payer: Medicare PPO

## 2020-07-06 ENCOUNTER — Other Ambulatory Visit: Payer: Self-pay

## 2020-07-06 DIAGNOSIS — Z23 Encounter for immunization: Secondary | ICD-10-CM | POA: Diagnosis not present

## 2020-07-16 ENCOUNTER — Other Ambulatory Visit: Payer: Self-pay

## 2020-07-16 ENCOUNTER — Ambulatory Visit
Admission: RE | Admit: 2020-07-16 | Discharge: 2020-07-16 | Disposition: A | Discharge status: Home / Self Care | Payer: Medicare PPO | Source: Ambulatory Visit | Attending: Internal Medicine | Admitting: Internal Medicine

## 2020-07-16 DIAGNOSIS — Z1231 Encounter for screening mammogram for malignant neoplasm of breast: Secondary | ICD-10-CM

## 2020-08-15 DIAGNOSIS — H02831 Dermatochalasis of right upper eyelid: Secondary | ICD-10-CM | POA: Diagnosis not present

## 2020-08-15 DIAGNOSIS — H524 Presbyopia: Secondary | ICD-10-CM | POA: Diagnosis not present

## 2020-08-15 DIAGNOSIS — H5203 Hypermetropia, bilateral: Secondary | ICD-10-CM | POA: Diagnosis not present

## 2020-08-15 DIAGNOSIS — H57813 Brow ptosis, bilateral: Secondary | ICD-10-CM | POA: Diagnosis not present

## 2020-08-15 DIAGNOSIS — H2513 Age-related nuclear cataract, bilateral: Secondary | ICD-10-CM | POA: Diagnosis not present

## 2020-08-15 DIAGNOSIS — H02834 Dermatochalasis of left upper eyelid: Secondary | ICD-10-CM | POA: Diagnosis not present

## 2020-08-16 ENCOUNTER — Telehealth (INDEPENDENT_AMBULATORY_CARE_PROVIDER_SITE_OTHER): Payer: Medicare PPO | Admitting: Internal Medicine

## 2020-08-16 ENCOUNTER — Encounter: Payer: Self-pay | Admitting: Internal Medicine

## 2020-08-16 DIAGNOSIS — J0100 Acute maxillary sinusitis, unspecified: Secondary | ICD-10-CM

## 2020-08-16 DIAGNOSIS — J019 Acute sinusitis, unspecified: Secondary | ICD-10-CM | POA: Insufficient documentation

## 2020-08-16 MED ORDER — CEFDINIR 300 MG PO CAPS: 300.0000 mg | ORAL_CAPSULE | Freq: Two times a day (BID) | ORAL | 0 refills | Status: DC

## 2020-08-16 NOTE — Progress Notes (Signed)
Virtual Visit via Video Note  I connected with [name] [name] on 08/16/20 at  3:40 PM EST by a video enabled telemedicine application and verified that I am speaking with the correct person using two identifiers.   I discussed the limitations of evaluation and management by telemedicine and the availability of in person appointments. The patient expressed understanding and agreed to proceed.  I was located at our [facility] office. The patient was at home. There was no one else present in the visit.   History of Present Illness:  C/o B earache, sinus pain x 3-4 d There has been no cough, chest pain, shortness of breath, abdominal pain, diarrhea, constipation, arthralgias, skin rashes.   Observations/Objective: The patient appears to be in no acute distress, looks well.  Assessment and Plan:  See my Assessment and Plan. Follow Up Instructions:    I discussed the assessment and treatment plan with the patient. The patient was provided an opportunity to ask questions and all were answered. The patient agreed with the plan and demonstrated an understanding of the instructions.   The patient was advised to call back or seek an in-person evaluation if the symptoms worsen or if the condition fails to improve as anticipated.  I provided face-to-face time during this encounter. We were at different locations.   Sonda Primes, MD

## 2020-08-16 NOTE — Assessment & Plan Note (Signed)
w/probable B OM. Cefdinir po

## 2020-10-05 DIAGNOSIS — H57813 Brow ptosis, bilateral: Secondary | ICD-10-CM | POA: Diagnosis not present

## 2020-10-05 DIAGNOSIS — H53483 Generalized contraction of visual field, bilateral: Secondary | ICD-10-CM | POA: Diagnosis not present

## 2020-10-05 DIAGNOSIS — L718 Other rosacea: Secondary | ICD-10-CM | POA: Diagnosis not present

## 2020-10-05 DIAGNOSIS — H02413 Mechanical ptosis of bilateral eyelids: Secondary | ICD-10-CM | POA: Diagnosis not present

## 2020-10-05 DIAGNOSIS — H02831 Dermatochalasis of right upper eyelid: Secondary | ICD-10-CM | POA: Diagnosis not present

## 2020-10-05 DIAGNOSIS — H0279 Other degenerative disorders of eyelid and periocular area: Secondary | ICD-10-CM | POA: Diagnosis not present

## 2020-10-05 DIAGNOSIS — H02834 Dermatochalasis of left upper eyelid: Secondary | ICD-10-CM | POA: Diagnosis not present

## 2020-10-15 DIAGNOSIS — H02413 Mechanical ptosis of bilateral eyelids: Secondary | ICD-10-CM | POA: Diagnosis not present

## 2020-10-15 DIAGNOSIS — H53483 Generalized contraction of visual field, bilateral: Secondary | ICD-10-CM | POA: Diagnosis not present

## 2020-10-15 DIAGNOSIS — L718 Other rosacea: Secondary | ICD-10-CM | POA: Diagnosis not present

## 2020-10-15 DIAGNOSIS — H57813 Brow ptosis, bilateral: Secondary | ICD-10-CM | POA: Diagnosis not present

## 2020-10-15 DIAGNOSIS — H02834 Dermatochalasis of left upper eyelid: Secondary | ICD-10-CM | POA: Diagnosis not present

## 2020-10-15 DIAGNOSIS — H0279 Other degenerative disorders of eyelid and periocular area: Secondary | ICD-10-CM | POA: Diagnosis not present

## 2020-10-15 DIAGNOSIS — H02831 Dermatochalasis of right upper eyelid: Secondary | ICD-10-CM | POA: Diagnosis not present

## 2020-11-28 DIAGNOSIS — Z01419 Encounter for gynecological examination (general) (routine) without abnormal findings: Secondary | ICD-10-CM | POA: Diagnosis not present

## 2020-11-28 LAB — HM PAP SMEAR: HM Pap smear: NORMAL

## 2020-11-29 ENCOUNTER — Encounter: Payer: Self-pay | Admitting: Internal Medicine

## 2021-01-09 ENCOUNTER — Encounter: Payer: Self-pay | Admitting: Internal Medicine

## 2021-01-09 ENCOUNTER — Telehealth (INDEPENDENT_AMBULATORY_CARE_PROVIDER_SITE_OTHER): Payer: Medicare PPO | Admitting: Internal Medicine

## 2021-01-09 DIAGNOSIS — R5381 Other malaise: Secondary | ICD-10-CM | POA: Diagnosis not present

## 2021-01-09 DIAGNOSIS — W57XXXA Bitten or stung by nonvenomous insect and other nonvenomous arthropods, initial encounter: Secondary | ICD-10-CM | POA: Diagnosis not present

## 2021-01-09 MED ORDER — DOXYCYCLINE HYCLATE 100 MG PO TABS: 100.0000 mg | ORAL_TABLET | Freq: Two times a day (BID) | ORAL | 0 refills | Status: DC

## 2021-01-09 NOTE — Progress Notes (Signed)
Virtual Visit via Video Note  I connected with [name] [name] on 01/09/21 at  1:20 PM EDT by a video enabled telemedicine application and verified that I am speaking with the correct person using two identifiers.   I discussed the limitations of evaluation and management by telemedicine and the availability of in person appointments. The patient expressed understanding and agreed to proceed.  I was located at our [facility] office. The patient was at home. There was no one else present in the visit.   History of Present Illness:   I am is complaining of a tick bite.  She found a tick on Saturday morning.  She removed it.  During the past 2 days she was not feeling well, AP, headaches.  The tick bite redness has spread.  There was no target lesions or other rashes present. Observations/Objective: The patient appears to be in no acute distress.  Assessment and Plan:  See my Assessment and Plan. Follow Up Instructions:    I discussed the assessment and treatment plan with the patient. The patient was provided an opportunity to ask questions and all were answered. The patient agreed with the plan and demonstrated an understanding of the instructions.   The patient was advised to call back or seek an in-person evaluation if the symptoms worsen or if the condition fails to improve as anticipated.  I provided face-to-face time during this encounter. We were at different locations.   Sonda Primes, MD

## 2021-01-09 NOTE — Assessment & Plan Note (Signed)
In the view of [name]'s systemic symptoms following the tick bite we will use doxycycline for 10 days.  Call if problems. She was also advised to do a COVID test

## 2021-01-09 NOTE — Assessment & Plan Note (Signed)
In the view of [name]'s systemic symptoms we will use doxycycline for 10 days.  Call if problems.

## 2021-03-22 DIAGNOSIS — J300 Vasomotor rhinitis: Secondary | ICD-10-CM | POA: Diagnosis not present

## 2021-03-22 DIAGNOSIS — Z299 Encounter for prophylactic measures, unspecified: Secondary | ICD-10-CM | POA: Diagnosis not present

## 2021-03-22 DIAGNOSIS — J4530 Mild persistent asthma, uncomplicated: Secondary | ICD-10-CM | POA: Diagnosis not present

## 2021-03-26 DIAGNOSIS — L718 Other rosacea: Secondary | ICD-10-CM | POA: Diagnosis not present

## 2021-03-26 DIAGNOSIS — L738 Other specified follicular disorders: Secondary | ICD-10-CM | POA: Diagnosis not present

## 2021-03-26 DIAGNOSIS — D1801 Hemangioma of skin and subcutaneous tissue: Secondary | ICD-10-CM | POA: Diagnosis not present

## 2021-03-26 DIAGNOSIS — L821 Other seborrheic keratosis: Secondary | ICD-10-CM | POA: Diagnosis not present

## 2021-03-26 DIAGNOSIS — Z85828 Personal history of other malignant neoplasm of skin: Secondary | ICD-10-CM | POA: Diagnosis not present

## 2021-03-26 DIAGNOSIS — L814 Other melanin hyperpigmentation: Secondary | ICD-10-CM | POA: Diagnosis not present

## 2021-04-18 ENCOUNTER — Ambulatory Visit (INDEPENDENT_AMBULATORY_CARE_PROVIDER_SITE_OTHER): Payer: Medicare PPO | Admitting: Internal Medicine

## 2021-04-18 ENCOUNTER — Other Ambulatory Visit: Payer: Self-pay

## 2021-04-18 ENCOUNTER — Encounter: Payer: Self-pay | Admitting: Internal Medicine

## 2021-04-18 VITALS — BP 130/78 | HR 77 | Temp 98.3°F | Ht 66.0 in | Wt 157.6 lb

## 2021-04-18 DIAGNOSIS — E785 Hyperlipidemia, unspecified: Secondary | ICD-10-CM

## 2021-04-18 DIAGNOSIS — Z0000 Encounter for general adult medical examination without abnormal findings: Secondary | ICD-10-CM | POA: Diagnosis not present

## 2021-04-18 DIAGNOSIS — I10 Essential (primary) hypertension: Secondary | ICD-10-CM

## 2021-04-18 LAB — URINALYSIS
Bilirubin Urine: NEGATIVE
Hgb urine dipstick: NEGATIVE
Ketones, ur: NEGATIVE
Leukocytes,Ua: NEGATIVE
Nitrite: NEGATIVE
Specific Gravity, Urine: <=1.005 — AB (ref 1.000–1.030)
Total Protein, Urine: NEGATIVE
Urine Glucose: NEGATIVE
Urobilinogen, UA: 0.2 (ref 0.0–1.0)
pH: 7 (ref 5.0–8.0)

## 2021-04-18 LAB — COMPREHENSIVE METABOLIC PANEL
ALT: 22 U/L (ref 0–35)
AST: 23 U/L (ref 0–37)
Albumin: 4.5 g/dL (ref 3.5–5.2)
Alkaline Phosphatase: 43 U/L (ref 39–117)
BUN: 12 mg/dL (ref 6–23)
CO2: 27 mEq/L (ref 19–32)
Calcium: 9.6 mg/dL (ref 8.4–10.5)
Chloride: 105 mEq/L (ref 96–112)
Creatinine, Ser: 0.71 mg/dL (ref 0.40–1.20)
GFR: 85.75 mL/min (ref >60.00)
Glucose, Bld: 98 mg/dL (ref 70–99)
Potassium: 4.3 mEq/L (ref 3.5–5.1)
Sodium: 142 mEq/L (ref 135–145)
Total Bilirubin: 0.6 mg/dL (ref 0.2–1.2)
Total Protein: 7.8 g/dL (ref 6.0–8.3)

## 2021-04-18 LAB — LIPID PANEL
Cholesterol: 184 mg/dL (ref 0–200)
HDL: 84.4 mg/dL (ref >39.00)
LDL Cholesterol: 85 mg/dL (ref 0–99)
NonHDL: 100.05
Total CHOL/HDL Ratio: 2
Triglycerides: 75 mg/dL (ref 0.0–149.0)
VLDL: 15 mg/dL (ref 0.0–40.0)

## 2021-04-18 LAB — CBC WITH DIFFERENTIAL/PLATELET
Basophils Absolute: 0.1 10*3/uL (ref 0.0–0.1)
Basophils Relative: 0.8 % (ref 0.0–3.0)
Eosinophils Absolute: 0 10*3/uL (ref 0.0–0.7)
Eosinophils Relative: 0.6 % (ref 0.0–5.0)
HCT: 42.5 % (ref 36.0–46.0)
Hemoglobin: 14.1 g/dL (ref 12.0–15.0)
Lymphocytes Relative: 25.9 % (ref 12.0–46.0)
Lymphs Abs: 1.9 10*3/uL (ref 0.7–4.0)
MCHC: 33.1 g/dL (ref 30.0–36.0)
MCV: 88.3 fl (ref 78.0–100.0)
Monocytes Absolute: 0.5 10*3/uL (ref 0.1–1.0)
Monocytes Relative: 6.9 % (ref 3.0–12.0)
Neutro Abs: 4.9 10*3/uL (ref 1.4–7.7)
Neutrophils Relative %: 65.8 % (ref 43.0–77.0)
Platelets: 323 10*3/uL (ref 150.0–400.0)
RBC: 4.82 Mil/uL (ref 3.87–5.11)
RDW: 14.1 % (ref 11.5–15.5)
WBC: 7.5 10*3/uL (ref 4.0–10.5)

## 2021-04-18 LAB — TSH: TSH: 1.28 u[IU]/mL (ref 0.35–5.50)

## 2021-04-18 MED ORDER — FAMOTIDINE 40 MG PO TABS: 40.0000 mg | ORAL_TABLET | Freq: Every day | ORAL | 3 refills | Status: DC

## 2021-04-18 MED ORDER — ATORVASTATIN CALCIUM 20 MG PO TABS: 20.0000 mg | ORAL_TABLET | Freq: Every day | ORAL | 3 refills | Status: DC

## 2021-04-18 MED ORDER — LOSARTAN POTASSIUM 50 MG PO TABS: 50.0000 mg | ORAL_TABLET | Freq: Every day | ORAL | 3 refills | Status: DC

## 2021-04-18 NOTE — Progress Notes (Signed)
Subjective:  Patient ID: [name] [name], female    DOB: May 02, 1951  Age: 70 y.o. MRN: [id number]  CC: Annual Exam   HPI [name] [name] presents for a well exam  Outpatient Medications Prior to Visit  Medication Sig Dispense Refill   albuterol (VENTOLIN HFA) 108 (90 Base) MCG/ACT inhaler Inhale 1-2 puffs into the lungs every 6 (six) hours as needed for wheezing or shortness of breath.     atorvastatin (LIPITOR) 20 MG tablet Take 1 tablet (20 mg total) by mouth daily. 90 tablet 3   b complex vitamins tablet Take 1 tablet by mouth daily. 100 tablet 3   BREO ELLIPTA 200-25 MCG/INH AEPB Inhale 1 puff into the lungs daily.  6   Cholecalciferol (VITAMIN D3) 50 MCG (2000 UT) capsule Take 1 capsule (2,000 Units total) by mouth daily. 100 capsule 3   famotidine (PEPCID) 40 MG tablet Take 1 tablet (40 mg total) by mouth daily. 90 tablet 3   fluticasone (FLONASE) 50 MCG/ACT nasal spray Place 2 sprays into both nostrils daily. 16 g 6   loratadine (CLARITIN) 10 MG tablet Take 10 mg by mouth daily.       losartan (COZAAR) 50 MG tablet Take 1 tablet (50 mg total) by mouth daily. 90 tablet 3   cefdinir (OMNICEF) 300 MG capsule Take 1 capsule (300 mg total) by mouth 2 (two) times daily. 20 capsule 0   doxycycline (VIBRA-TABS) 100 MG tablet Take 1 tablet (100 mg total) by mouth 2 (two) times daily. (Patient not taking: Reported on 04/18/2021) 20 tablet 0   No facility-administered medications prior to visit.    ROS: Review of Systems  Constitutional:  Negative for activity change, appetite change, chills, fatigue and unexpected weight change.  HENT:  Negative for congestion, mouth sores and sinus pressure.   Eyes:  Negative for visual disturbance.  Respiratory:  Negative for cough and chest tightness.   Gastrointestinal:  Negative for abdominal pain and nausea.  Genitourinary:  Negative for difficulty urinating, frequency and vaginal pain.  Musculoskeletal:  Negative for back  pain and gait problem.  Skin:  Negative for pallor and rash.  Neurological:  Negative for dizziness, tremors, weakness, numbness and headaches.  Psychiatric/Behavioral:  Negative for confusion and sleep disturbance.    Objective:  BP 130/78 (BP Location: Left Arm)   Pulse 77   Temp 98.3 F (36.8 C) (Oral)   Ht 5\' 6"  (1.676 m)   Wt 157 lb 9.6 oz (71.5 kg)   SpO2 96%   BMI 25.44 kg/m   BP Readings from Last 3 Encounters:  04/18/21 130/78  04/17/20 122/70  08/27/19 138/78    Wt Readings from Last 3 Encounters:  04/18/21 157 lb 9.6 oz (71.5 kg)  04/17/20 157 lb 3.2 oz (71.3 kg)  07/13/19 154 lb (69.9 kg)    Physical Exam Constitutional:      General: She is not in acute distress.    Appearance: She is well-developed.  HENT:     Head: Normocephalic.     Right Ear: External ear normal.     Left Ear: External ear normal.     Nose: Nose normal.  Eyes:     General:        Right eye: No discharge.        Left eye: No discharge.     Conjunctiva/sclera: Conjunctivae normal.     Pupils: Pupils are equal, round, and reactive to light.  Neck:     Thyroid: No  thyromegaly.     Vascular: No JVD.     Trachea: No tracheal deviation.  Cardiovascular:     Rate and Rhythm: Normal rate and regular rhythm.     Heart sounds: Normal heart sounds.  Pulmonary:     Effort: No respiratory distress.     Breath sounds: No stridor. No wheezing.  Abdominal:     General: Bowel sounds are normal. There is no distension.     Palpations: Abdomen is soft. There is no mass.     Tenderness: There is no abdominal tenderness. There is no guarding or rebound.  Musculoskeletal:        General: No tenderness.     Cervical back: Normal range of motion and neck supple. No rigidity.  Lymphadenopathy:     Cervical: No cervical adenopathy.  Skin:    Findings: No erythema or rash.  Neurological:     Cranial Nerves: No cranial nerve deficit.     Motor: No abnormal muscle tone.     Coordination:  Coordination normal.     Deep Tendon Reflexes: Reflexes normal.  Psychiatric:        Behavior: Behavior normal.        Thought Content: Thought content normal.        Judgment: Judgment normal.    Lab Results  Component Value Date   WBC 6.3 04/17/2020   HGB 14.1 04/17/2020   HCT 42.2 04/17/2020   PLT 321 04/17/2020   GLUCOSE 95 04/17/2020   CHOL 202 (H) 04/17/2020   TRIG 103 04/17/2020   HDL 73 04/17/2020   LDLCALC 109 (H) 04/17/2020   ALT 18 04/17/2020   AST 19 04/17/2020   NA 141 04/17/2020   K 4.4 04/17/2020   CL 105 04/17/2020   CREATININE 0.73 04/17/2020   BUN 13 04/17/2020   CO2 26 04/17/2020   TSH 1.66 04/17/2020    MM 3D SCREEN BREAST BILATERAL  Result Date: 07/17/2020 CLINICAL DATA:  Screening. EXAM: DIGITAL SCREENING BILATERAL MAMMOGRAM WITH TOMO AND CAD COMPARISON:  Previous exam(s). ACR Breast Density Category b: There are scattered areas of fibroglandular density. FINDINGS: There are no findings suspicious for malignancy. Images were processed with CAD. IMPRESSION: No mammographic evidence of malignancy. A result letter of this screening mammogram will be mailed directly to the patient. RECOMMENDATION: Screening mammogram in one year. (Code:SM-B-01Y) BI-RADS CATEGORY  1: Negative. Electronically Signed   By: Gerome Sam III M.D   On: 07/17/2020 14:02    Assessment & Plan:     Sonda Primes, MD

## 2021-04-18 NOTE — Addendum Note (Signed)
Addended by: Sharayah Renfrow B on: 04/18/2021 09:24 AM   Modules accepted: Orders  

## 2021-04-18 NOTE — Assessment & Plan Note (Addendum)
  We discussed age appropriate health related issues, including available/recomended screening tests and vaccinations. Labs were ordered to be later reviewed . All questions were answered. We discussed one or more of the following - seat belt use, use of sunscreen/sun exposure exercise, fall risk reduction, second hand smoke exposure, firearm use and storage, seat belt use, a need for adhering to healthy diet and exercise. Labs were ordered.  All questions were answered.  GYN - Dr Chestine Spore, Mammo q 12 mo  Colon due in 2020 Dr Marina Goodell Cologuard 2017, 2021 due 2024 A  cardiac CT scan for calcium scoring done COVID 19 vaccine discussed, the patient declined.

## 2021-04-18 NOTE — Patient Instructions (Signed)
For a mild COVID-19 case - take zinc 50 mg a day for 1 week, vitamin C 1000 mg daily for 1 week, vitamin D2 50,000 units weekly for 2 months (unless  taking vitamin D daily already), an antioxidant Quercetin 500 mg twice a day for 1 week (if you can get it quick enough). Take Allegra or Benadryl.  Maintain good oral hydration and take Tylenol for high fever.  Call if problems. Isolate for 5 days, then wear a mask for 5 days per CDC.  

## 2021-04-18 NOTE — Addendum Note (Signed)
Addended by: Tresa Garter on: 04/18/2021 09:18 AM   Modules accepted: Orders

## 2021-04-18 NOTE — Assessment & Plan Note (Signed)
On Rx 

## 2021-06-04 ENCOUNTER — Other Ambulatory Visit: Payer: Self-pay | Admitting: Internal Medicine

## 2021-06-04 DIAGNOSIS — Z1231 Encounter for screening mammogram for malignant neoplasm of breast: Secondary | ICD-10-CM

## 2021-07-17 ENCOUNTER — Telehealth: Payer: Self-pay | Admitting: Internal Medicine

## 2021-07-17 NOTE — Telephone Encounter (Signed)
N/A unable to leave a message for patient to call  back to schedule Medicare Annual Wellness Visit   Last AWV  03/01/20  Please schedule at anytime with [facility] Advisor if patient calls the office back.    40 Minutes appointment   Any questions, please call me at [phone]

## 2021-07-18 ENCOUNTER — Ambulatory Visit
Admission: RE | Admit: 2021-07-18 | Discharge: 2021-07-18 | Disposition: A | Discharge status: Home / Self Care | Payer: Medicare PPO | Source: Ambulatory Visit | Attending: Internal Medicine | Admitting: Internal Medicine

## 2021-07-18 ENCOUNTER — Other Ambulatory Visit: Payer: Self-pay

## 2021-07-18 DIAGNOSIS — Z1231 Encounter for screening mammogram for malignant neoplasm of breast: Secondary | ICD-10-CM

## 2021-07-20 IMAGING — CT CT HEART SCORING
2 series · 16 of 20 positions shown, 18 images · non-contrast
Comparison: None.
COMPARISON: None.

Addendum:
EXAM:
OVER-READ INTERPRETATION  CT CHEST

The following report is an over-read performed by radiologist Dr.
Julio Alonso Bloom [REDACTED] on 06/09/2019. This over-read
does not include interpretation of cardiac or coronary anatomy or
pathology. The coronary calcium score interpretation by the
cardiologist is attached.
CLINICAL DATA: Risk stratification
Coronary Calcium Score
TECHNIQUE: The patient was scanned on a Siemens Somatom 64 slice scanner. Axial
non-contrast 3 mm slices were carried out through the heart. The
data set was analyzed on a dedicated work station and scored using
the Agatson method.

[Series 2: casc 3.0 i36f 2 bestdiast 67 % · axial · 0.30mm/px · z∈[-224,-119]mm · 8 of 47 slices shown, 10 images]
[im 6/47  vessel]
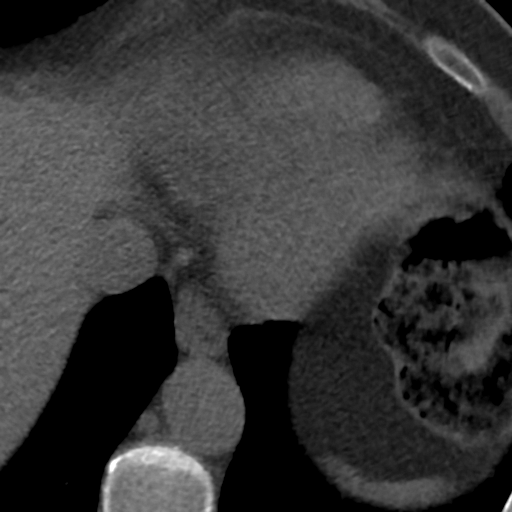
[im 6/47  lung]
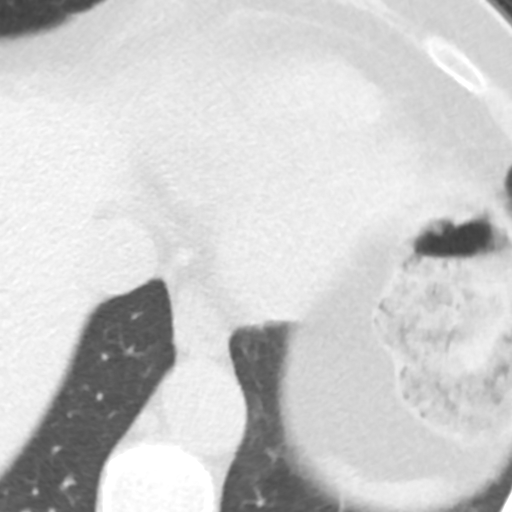
[im 11/47  vessel]
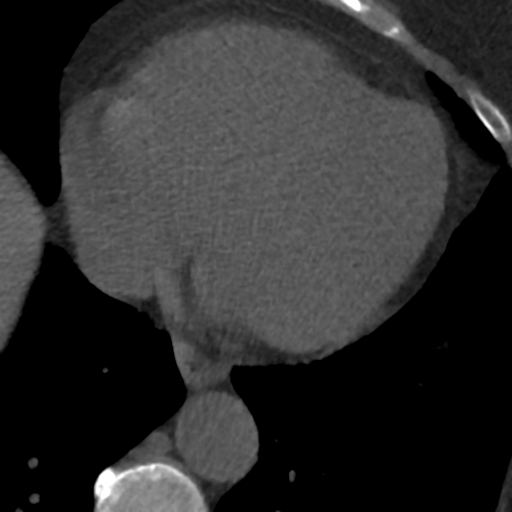
[im 16/47  vessel]
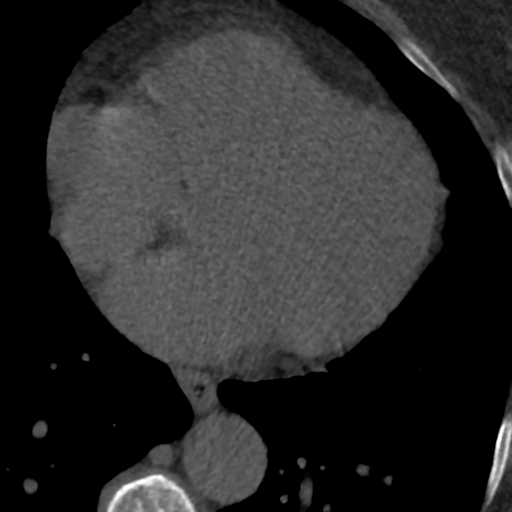
[im 21/47  vessel]
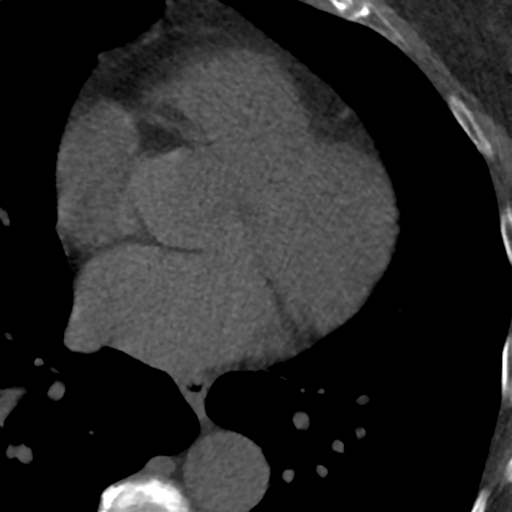
[im 26/47  vessel]
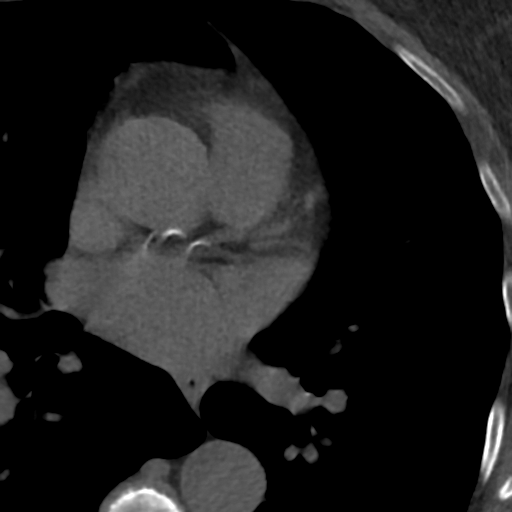
[im 26/47  lung]
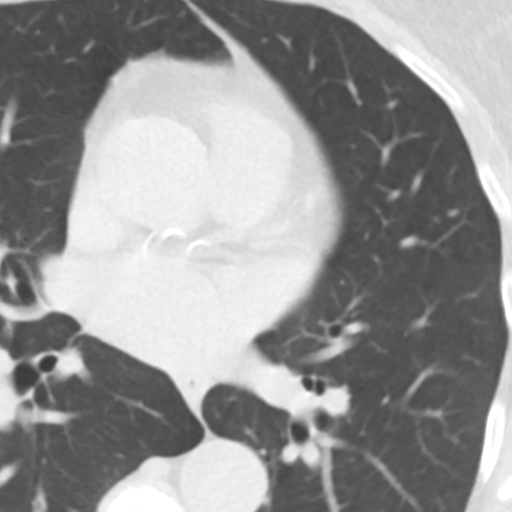
[im 31/47  vessel]
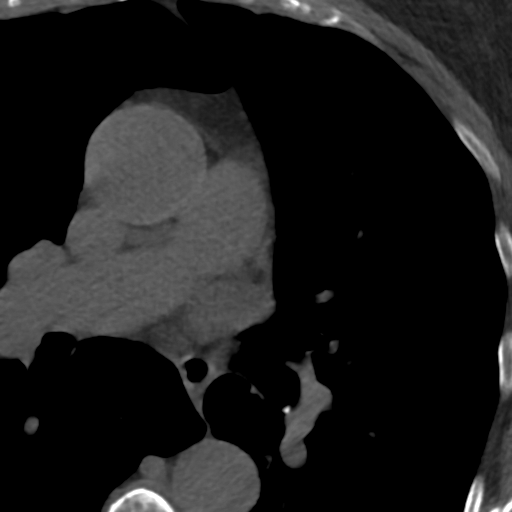
[im 36/47  vessel]
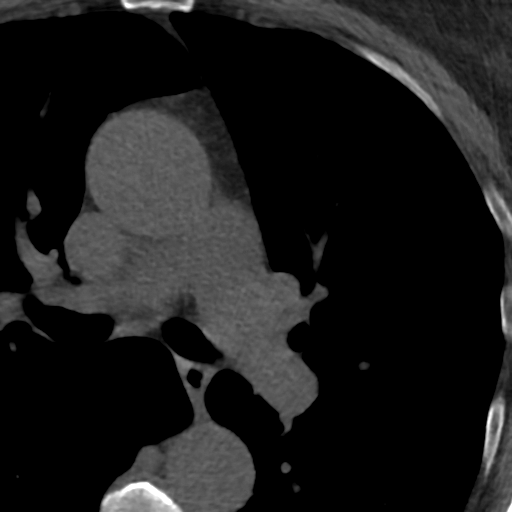
[im 41/47  vessel]
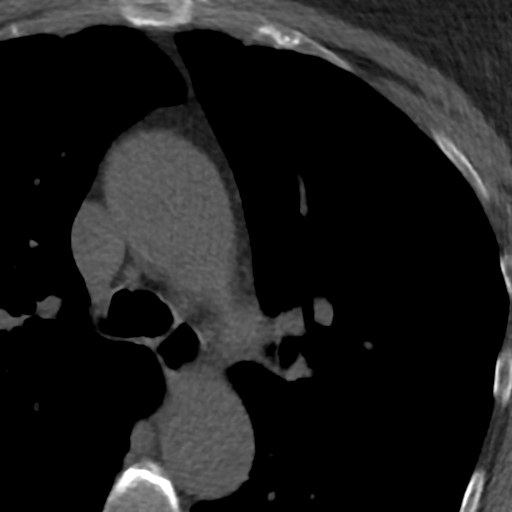

[Series 4: lung st 67 % · axial · 0.58mm/px · z∈[-224,-119]mm · 8 of 47 slices shown]
[im 6/47  lung]
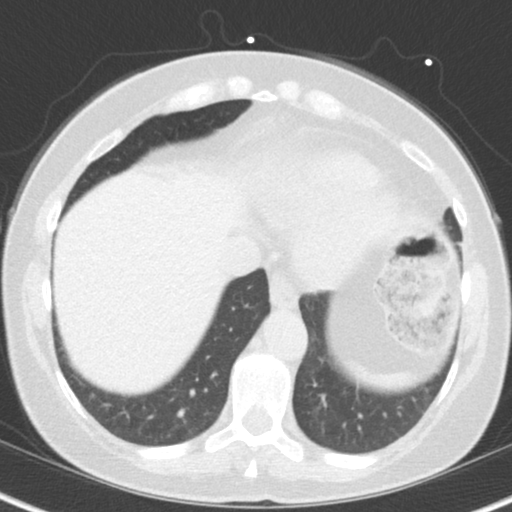
[im 11/47  lung]
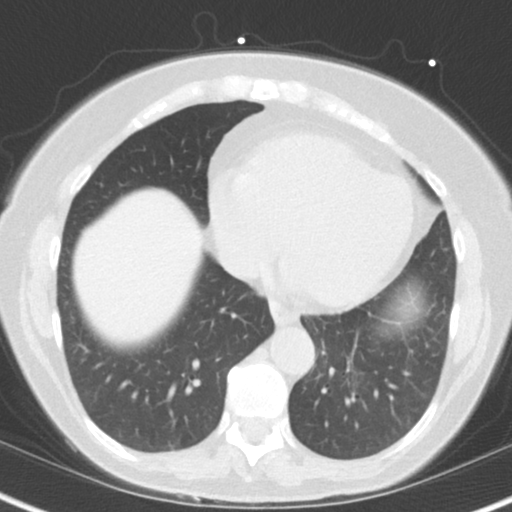
[im 16/47  lung]
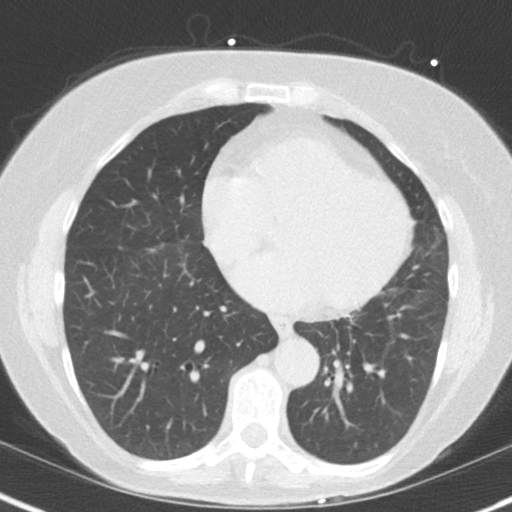
[im 21/47  lung]
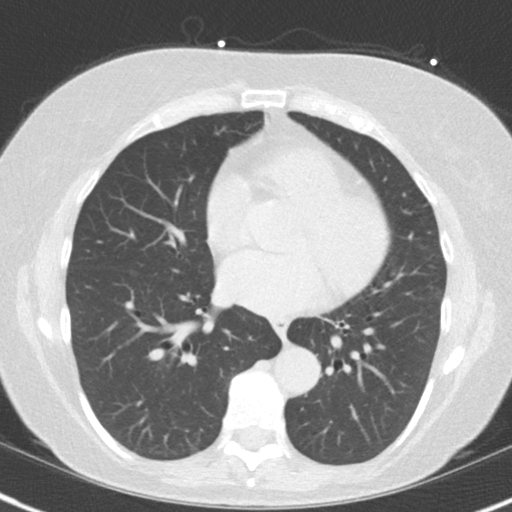
[im 26/47  lung]
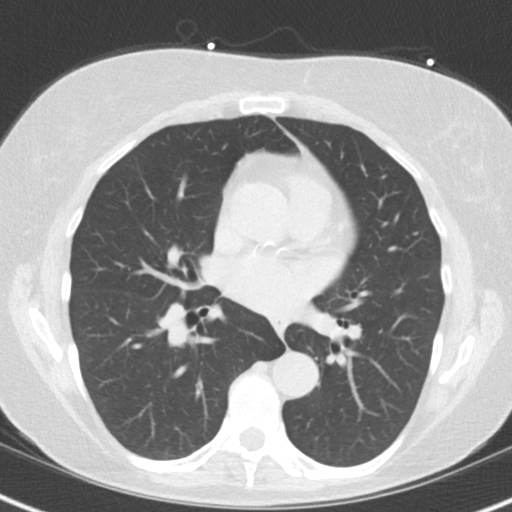
[im 31/47  lung]
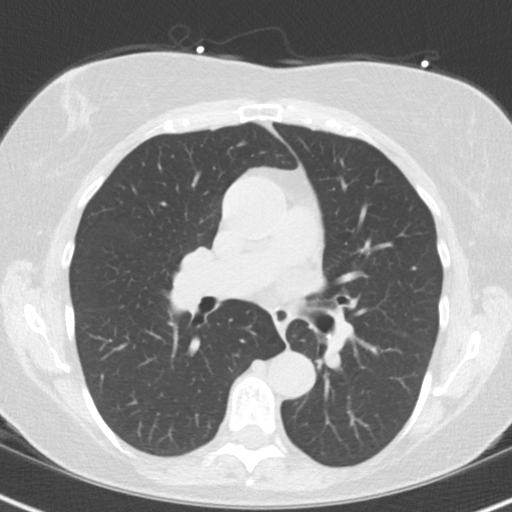
[im 36/47  lung]
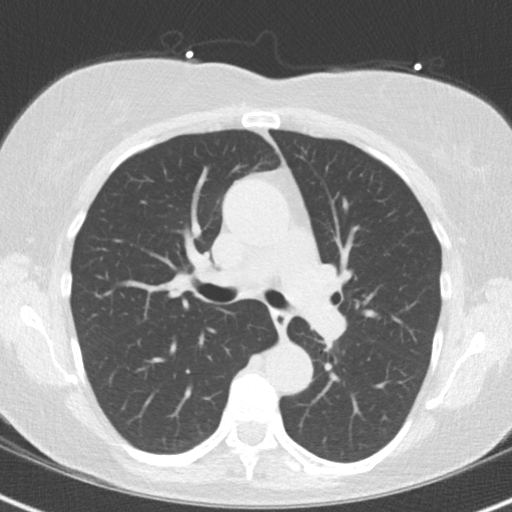
[im 41/47  lung]
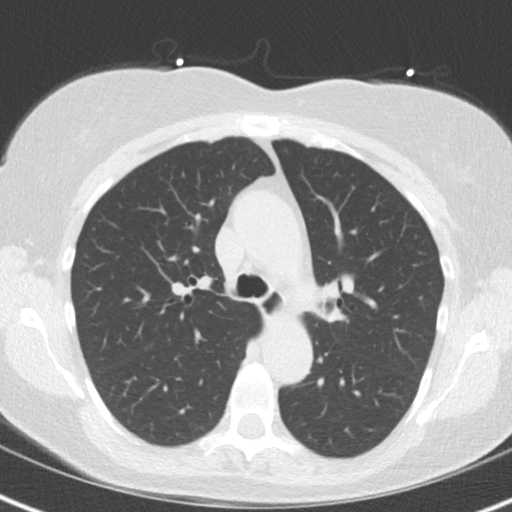

[16 of 20 positions shown; findings below may reference images not displayed]

FINDINGS: Vascular: Heart is normal size.  Visualized aorta normal caliber.

Mediastinum/Nodes: No adenopathy in the lower mediastinum or hila.

Lungs/Pleura: Visualized lungs clear.  No effusions.

Upper Abdomen: Imaging into the upper abdomen shows no acute
findings.

Musculoskeletal: Chest wall soft tissues are unremarkable. No acute
bony abnormality.
IMPRESSION: No acute or significant extracardiac abnormality.
FINDINGS: Non-cardiac: See separate report from [REDACTED].

Ascending aorta: Normal diameter 3.5 cm

Pericardium: Normal

Coronary arteries: Calcium noted in LM and proximal LAD
IMPRESSION: Coronary calcium score of 89. This was 73 [REDACTED] percentile for age and
sex matched control.

Maillard Lije

*** End of Addendum ***
EXAM:
OVER-READ INTERPRETATION  CT CHEST

The following report is an over-read performed by radiologist Dr.
Julio Alonso Bloom [REDACTED] on 06/09/2019. This over-read
does not include interpretation of cardiac or coronary anatomy or
pathology. The coronary calcium score interpretation by the
cardiologist is attached.
FINDINGS: Vascular: Heart is normal size.  Visualized aorta normal caliber.

Mediastinum/Nodes: No adenopathy in the lower mediastinum or hila.

Lungs/Pleura: Visualized lungs clear.  No effusions.

Upper Abdomen: Imaging into the upper abdomen shows no acute
findings.

Musculoskeletal: Chest wall soft tissues are unremarkable. No acute
bony abnormality.
IMPRESSION: No acute or significant extracardiac abnormality.

## 2021-08-14 ENCOUNTER — Encounter: Payer: Self-pay | Admitting: Internal Medicine

## 2021-08-15 DIAGNOSIS — H5203 Hypermetropia, bilateral: Secondary | ICD-10-CM | POA: Diagnosis not present

## 2021-08-15 DIAGNOSIS — H02834 Dermatochalasis of left upper eyelid: Secondary | ICD-10-CM | POA: Diagnosis not present

## 2021-08-15 DIAGNOSIS — H524 Presbyopia: Secondary | ICD-10-CM | POA: Diagnosis not present

## 2021-08-15 DIAGNOSIS — H02831 Dermatochalasis of right upper eyelid: Secondary | ICD-10-CM | POA: Diagnosis not present

## 2021-08-15 DIAGNOSIS — H25813 Combined forms of age-related cataract, bilateral: Secondary | ICD-10-CM | POA: Diagnosis not present

## 2022-01-29 ENCOUNTER — Ambulatory Visit
Admission: RE | Admit: 2022-01-29 | Discharge: 2022-01-29 | Disposition: A | Discharge status: Home / Self Care | Payer: Medicare PPO | Source: Ambulatory Visit | Attending: Internal Medicine | Admitting: Internal Medicine

## 2022-01-29 VITALS — BP 152/74 | HR 75 | Temp 97.3°F | Resp 18 | Ht 66.0 in | Wt 155.0 lb

## 2022-01-29 DIAGNOSIS — H65191 Other acute nonsuppurative otitis media, right ear: Secondary | ICD-10-CM

## 2022-01-29 DIAGNOSIS — H6592 Unspecified nonsuppurative otitis media, left ear: Secondary | ICD-10-CM

## 2022-01-29 MED ORDER — AMOXICILLIN 875 MG PO TABS: 875.0000 mg | ORAL_TABLET | Freq: Two times a day (BID) | ORAL | 0 refills | Status: DC

## 2022-01-29 NOTE — ED Provider Notes (Signed)
[facility]    CSN: [id number] Arrival date & time: 01/29/22  1047      History   Chief Complaint Chief Complaint  Patient presents with   Ear Fullness    Ear pain - Entered by patient   Appointment    HPI [name] [name] is a 71 y.o. female.   Patient presents with bilateral ear discomfort.  She describes it as "ears feeling full and achy".  Denies any associated upper respiratory symptoms or fever.  Denies trauma, foreign body, purulent drainage.  She does endorse some decreased hearing from the right ear.  Patient has used Flonase nasal spray with minimal improvement.   Ear Fullness   Past Medical History:  Diagnosis Date   Allergic rhinitis    Anemia    NOS   Asthma    Carotid stenosis    mild   GERD (gastroesophageal reflux disease)    Dr. Marina Goodell   Hyperlipidemia    Migraine    Osteopenia     Patient Active Problem List   Diagnosis Date Noted   Tick bite 01/09/2021   Malaise 01/09/2021   Acute sinusitis 08/16/2020   Elevated coronary artery calcium score 06/30/2019   Coronary atherosclerosis due to lipid rich plaque 06/10/2019   Laryngitis 07/22/2018   Finger laceration 07/13/2018   Hypercholesterolemia 11/16/2017   Osteopenia 11/16/2017   Thoracic back pain 11/14/2014   Seromucinous otitis media 07/03/2011   HTN (hypertension) 07/03/2011   Well adult exam 02/27/2011   CONSTIPATION, CHRONIC 10/28/2010   NAUSEA 10/28/2010   ABDOMINAL PAIN, EPIGASTRIC 10/28/2010   BRONCHITIS, ACUTE 10/16/2009   UPPER RESPIRATORY INFECTION (URI) 07/21/2008   VERTIGO 07/21/2008   CONTACT DERMATITIS&OTHER ECZEMA DUE UNSPEC CAUSE 06/15/2008   OTHER SPECIFIED PRURITIC CONDITIONS 06/15/2008   INSOMNIA 05/08/2008   BURN OF UNSPECIFIED DEGREE OF PALM OF HAND 05/08/2008   CERVICALGIA 03/30/2008   HEMORRHOIDS 11/03/2007   ESOPHAGEAL STRICTURE 11/03/2007   OSTEOARTHRITIS 11/03/2007   HEADACHE 11/03/2007   ROTATOR CUFF SPRAIN AND STRAIN 11/03/2007    Unspecified otitis media 10/08/2007   ANEMIA-NOS 10/04/2007   ALLERGIC RHINITIS 10/04/2007   GERD 08/31/2007   Dyslipidemia 07/27/2007   CAROTID ARTERY STENOSIS 07/27/2007   Asthma 07/27/2007   OSTEOPENIA 07/27/2007    Past Surgical History:  Procedure Laterality Date   TUBAL LIGATION      OB History   No obstetric history on file.      Home Medications    Prior to Admission medications   Medication Sig Start Date End Date Taking? Authorizing Provider  albuterol (VENTOLIN HFA) 108 (90 Base) MCG/ACT inhaler Inhale 1-2 puffs into the lungs every 6 (six) hours as needed for wheezing or shortness of breath.   Yes [provider]  amoxicillin (AMOXIL) 875 MG tablet Take 1 tablet (875 mg total) by mouth 2 (two) times daily for 10 days. 01/29/22 02/08/22 Yes Maegen Wigle, Acie Fredrickson, FNP  atorvastatin (LIPITOR) 20 MG tablet Take 1 tablet (20 mg total) by mouth daily. 04/18/21 04/18/22 Yes Plotnikov, Georgina Quint, MD  b complex vitamins tablet Take 1 tablet by mouth daily. 04/12/19  Yes Plotnikov, Georgina Quint, MD  BREO ELLIPTA 200-25 MCG/INH AEPB Inhale 1 puff into the lungs daily. 02/15/16  Yes [provider]  Cholecalciferol (VITAMIN D3) 50 MCG (2000 UT) capsule Take 1 capsule (2,000 Units total) by mouth daily. 04/17/20  Yes Plotnikov, Georgina Quint, MD  famotidine (PEPCID) 40 MG tablet Take 1 tablet (40 mg total) by mouth daily. 04/18/21  Yes Plotnikov, Georgina Quint, MD  fluticasone (FLONASE) 50 MCG/ACT nasal spray Place 2 sprays into both nostrils daily. 09/20/18  Yes Olive Bass, FNP  loratadine (CLARITIN) 10 MG tablet Take 10 mg by mouth daily.     Yes [provider]  losartan (COZAAR) 50 MG tablet Take 1 tablet (50 mg total) by mouth daily. 04/18/21  Yes Plotnikov, Georgina Quint, MD    Family History Family History  Problem Relation Age of Onset   Cancer Father 83       Chest   Hypertension Mother     Social History Social History   Tobacco Use   Smoking status:  Never   Smokeless tobacco: Never  Substance Use Topics   Alcohol use: No   Drug use: No     Allergies   Amoxicillin-pot clavulanate, Alendronate sodium, and Amoxicillin-pot clavulanate   Review of Systems Review of Systems Per HPI  Physical Exam Triage Vital Signs ED Triage Vitals  Enc Vitals Group     BP 01/29/22 1229 (!) 152/74     Pulse Rate 01/29/22 1229 75     Resp 01/29/22 1229 18     Temp 01/29/22 1229 (!) 97.3 F (36.3 C)     Temp Source 01/29/22 1229 Oral     SpO2 01/29/22 1229 97 %     Weight 01/29/22 1231 155 lb (70.3 kg)     Height 01/29/22 1231 5\' 6"  (1.676 m)     Head Circumference --      Peak Flow --      Pain Score 01/29/22 1231 6     Pain Loc --      Pain Edu? --      Excl. in GC? --    No data found.  Updated Vital Signs BP (!) 152/74 (BP Location: Left Arm)   Pulse 75   Temp (!) 97.3 F (36.3 C) (Oral)   Resp 18   Ht 5\' 6"  (1.676 m)   Wt 155 lb (70.3 kg)   SpO2 97%   BMI 25.02 kg/m   Visual Acuity Right Eye Distance:   Left Eye Distance:   Bilateral Distance:    Right Eye Near:   Left Eye Near:    Bilateral Near:     Physical Exam Constitutional:      General: She is not in acute distress.    Appearance: Normal appearance. She is not toxic-appearing or diaphoretic.  HENT:     Head: Normocephalic and atraumatic.     Right Ear: Ear canal and external ear normal. No drainage, swelling or tenderness. A middle ear effusion is present. No mastoid tenderness. Tympanic membrane is erythematous. Tympanic membrane is not perforated or bulging.     Left Ear: Ear canal and external ear normal. No drainage, swelling or tenderness. A middle ear effusion is present. No mastoid tenderness. Tympanic membrane is not perforated, erythematous or bulging.  Eyes:     Extraocular Movements: Extraocular movements intact.     Conjunctiva/sclera: Conjunctivae normal.  Pulmonary:     Effort: Pulmonary effort is normal.  Neurological:     General: No  focal deficit present.     Mental Status: She is alert and oriented to person, place, and time. Mental status is at baseline.  Psychiatric:        Mood and Affect: Mood normal.        Behavior: Behavior normal.        Thought Content: Thought content normal.  Judgment: Judgment normal.     UC Treatments / Results  Labs (all labs ordered are listed, but only abnormal results are displayed) Labs Reviewed - No data to display  EKG   Radiology No results found.  Procedures Procedures (including critical care time)  Medications Ordered in UC Medications - No data to display  Initial Impression / Assessment and Plan / UC Course  I have reviewed the triage vital signs and the nursing notes.  Pertinent labs & imaging results that were available during my care of the patient were reviewed by me and considered in my medical decision making (see chart for details).     Right otitis media present as well as left middle ear effusion.  Will treat with amoxicillin given that symptoms have been refractory to Flonase.  Discussed supportive care with patient.  Advised patient to follow-up if symptoms persist or worsen.  Patient verbalized understanding and was agreeable with plan. Final Clinical Impressions(s) / UC Diagnoses   Final diagnoses:  Other non-recurrent acute nonsuppurative otitis media of right ear  Fluid level behind tympanic membrane of left ear     Discharge Instructions      You have an ear infection in the right ear and fluid behind your eardrum in your left ear.  An antibiotic has been prescribed to help treat this.  Please follow-up if symptoms persist or worsen.    ED Prescriptions     Medication Sig Dispense Auth. Provider   amoxicillin (AMOXIL) 875 MG tablet Take 1 tablet (875 mg total) by mouth 2 (two) times daily for 10 days. 20 tablet [place], Acie FredricksonHaley E, [place]FNP      PDMP not reviewed this encounter.   Gustavus BryantMound, Nygeria Lager E, [place]NP 01/29/22 1251

## 2022-01-29 NOTE — Discharge Instructions (Signed)
You have an ear infection in the right ear and fluid behind your eardrum in your left ear.  An antibiotic has been prescribed to help treat this.  Please follow-up if symptoms persist or worsen.

## 2022-01-29 NOTE — ED Triage Notes (Signed)
Patient c/o feeling like she's talking in a barrel.  Both ears feel full and ache x 4 days.  Patient has been taken Flonase Nasal Spray.

## 2022-02-07 ENCOUNTER — Telehealth: Payer: Self-pay | Admitting: Internal Medicine

## 2022-02-07 DIAGNOSIS — J300 Vasomotor rhinitis: Secondary | ICD-10-CM | POA: Diagnosis not present

## 2022-02-07 DIAGNOSIS — J4530 Mild persistent asthma, uncomplicated: Secondary | ICD-10-CM | POA: Diagnosis not present

## 2022-02-07 NOTE — Telephone Encounter (Signed)
Pt would like a refill of her amoxicillin 875mg . Pt states her ear is still hurting her and the infection has not gone away yet.

## 2022-02-08 MED ORDER — AMOXICILLIN 875 MG PO TABS: 875.0000 mg | ORAL_TABLET | Freq: Two times a day (BID) | ORAL | 0 refills | Status: AC

## 2022-02-08 NOTE — Telephone Encounter (Signed)
Okay.  Amoxicillin prescription was renewed.  Office visit if not better.  Thanks

## 2022-03-31 DIAGNOSIS — L821 Other seborrheic keratosis: Secondary | ICD-10-CM | POA: Diagnosis not present

## 2022-03-31 DIAGNOSIS — D225 Melanocytic nevi of trunk: Secondary | ICD-10-CM | POA: Diagnosis not present

## 2022-03-31 DIAGNOSIS — D2372 Other benign neoplasm of skin of left lower limb, including hip: Secondary | ICD-10-CM | POA: Diagnosis not present

## 2022-03-31 DIAGNOSIS — D1801 Hemangioma of skin and subcutaneous tissue: Secondary | ICD-10-CM | POA: Diagnosis not present

## 2022-03-31 DIAGNOSIS — Z85828 Personal history of other malignant neoplasm of skin: Secondary | ICD-10-CM | POA: Diagnosis not present

## 2022-03-31 DIAGNOSIS — D2272 Melanocytic nevi of left lower limb, including hip: Secondary | ICD-10-CM | POA: Diagnosis not present

## 2022-03-31 DIAGNOSIS — D2271 Melanocytic nevi of right lower limb, including hip: Secondary | ICD-10-CM | POA: Diagnosis not present

## 2022-04-21 ENCOUNTER — Ambulatory Visit: Payer: Medicare PPO | Admitting: Internal Medicine

## 2022-04-21 ENCOUNTER — Encounter: Payer: Self-pay | Admitting: Internal Medicine

## 2022-04-21 VITALS — BP 112/70 | HR 69 | Temp 98.9°F | Ht 66.0 in | Wt 155.6 lb

## 2022-04-21 DIAGNOSIS — Z0000 Encounter for general adult medical examination without abnormal findings: Secondary | ICD-10-CM | POA: Diagnosis not present

## 2022-04-21 DIAGNOSIS — G4700 Insomnia, unspecified: Secondary | ICD-10-CM | POA: Diagnosis not present

## 2022-04-21 DIAGNOSIS — H8110 Benign paroxysmal vertigo, unspecified ear: Secondary | ICD-10-CM | POA: Diagnosis not present

## 2022-04-21 DIAGNOSIS — I10 Essential (primary) hypertension: Secondary | ICD-10-CM

## 2022-04-21 LAB — CBC WITH DIFFERENTIAL/PLATELET
Basophils Absolute: 0.1 10*3/uL (ref 0.0–0.1)
Basophils Relative: 1.1 % (ref 0.0–3.0)
Eosinophils Absolute: 0.1 10*3/uL (ref 0.0–0.7)
Eosinophils Relative: 1 % (ref 0.0–5.0)
HCT: 43.1 % (ref 36.0–46.0)
Hemoglobin: 14.3 g/dL (ref 12.0–15.0)
Lymphocytes Relative: 27.8 % (ref 12.0–46.0)
Lymphs Abs: 1.8 10*3/uL (ref 0.7–4.0)
MCHC: 33.3 g/dL (ref 30.0–36.0)
MCV: 88.4 fl (ref 78.0–100.0)
Monocytes Absolute: 0.6 10*3/uL (ref 0.1–1.0)
Monocytes Relative: 9 % (ref 3.0–12.0)
Neutro Abs: 4 10*3/uL (ref 1.4–7.7)
Neutrophils Relative %: 61.1 % (ref 43.0–77.0)
Platelets: 305 10*3/uL (ref 150.0–400.0)
RBC: 4.87 Mil/uL (ref 3.87–5.11)
RDW: 13.6 % (ref 11.5–15.5)
WBC: 6.6 10*3/uL (ref 4.0–10.5)

## 2022-04-21 LAB — COMPREHENSIVE METABOLIC PANEL
ALT: 16 U/L (ref 0–35)
AST: 24 U/L (ref 0–37)
Albumin: 4.5 g/dL (ref 3.5–5.2)
Alkaline Phosphatase: 50 U/L (ref 39–117)
BUN: 13 mg/dL (ref 6–23)
CO2: 26 mEq/L (ref 19–32)
Calcium: 9.6 mg/dL (ref 8.4–10.5)
Chloride: 104 mEq/L (ref 96–112)
Creatinine, Ser: 0.7 mg/dL (ref 0.40–1.20)
GFR: 86.6 mL/min (ref >60.00)
Glucose, Bld: 96 mg/dL (ref 70–99)
Potassium: 4.3 mEq/L (ref 3.5–5.1)
Sodium: 141 mEq/L (ref 135–145)
Total Bilirubin: 0.6 mg/dL (ref 0.2–1.2)
Total Protein: 7.9 g/dL (ref 6.0–8.3)

## 2022-04-21 LAB — LIPID PANEL
Cholesterol: 193 mg/dL (ref 0–200)
HDL: 72 mg/dL (ref >39.00)
LDL Cholesterol: 103 mg/dL — ABNORMAL HIGH (ref 0–99)
NonHDL: 121.04
Total CHOL/HDL Ratio: 3
Triglycerides: 88 mg/dL (ref 0.0–149.0)
VLDL: 17.6 mg/dL (ref 0.0–40.0)

## 2022-04-21 LAB — URINALYSIS
Bilirubin Urine: NEGATIVE
Ketones, ur: NEGATIVE
Leukocytes,Ua: NEGATIVE
Nitrite: NEGATIVE
Specific Gravity, Urine: <=1.005 — AB (ref 1.000–1.030)
Total Protein, Urine: NEGATIVE
Urine Glucose: NEGATIVE
Urobilinogen, UA: 0.2 (ref 0.0–1.0)
pH: 7 (ref 5.0–8.0)

## 2022-04-21 LAB — TSH: TSH: 1.64 u[IU]/mL (ref 0.35–5.50)

## 2022-04-21 MED ORDER — FLUTICASONE PROPIONATE 50 MCG/ACT NA SUSP: 2.0000 | Freq: Every day | NASAL | 6 refills | Status: DC

## 2022-04-21 MED ORDER — FAMOTIDINE 40 MG PO TABS: 40.0000 mg | ORAL_TABLET | Freq: Every day | ORAL | 3 refills | Status: DC

## 2022-04-21 MED ORDER — MECLIZINE HCL 12.5 MG PO TABS: 12.5000 mg | ORAL_TABLET | Freq: Three times a day (TID) | ORAL | 1 refills | Status: AC | PRN

## 2022-04-21 MED ORDER — LOSARTAN POTASSIUM 50 MG PO TABS: 50.0000 mg | ORAL_TABLET | Freq: Every day | ORAL | 3 refills | Status: DC

## 2022-04-21 MED ORDER — ATORVASTATIN CALCIUM 20 MG PO TABS: 20.0000 mg | ORAL_TABLET | Freq: Every day | ORAL | 3 refills | Status: DC

## 2022-04-21 NOTE — Assessment & Plan Note (Signed)
Meclizine prn 

## 2022-04-21 NOTE — Progress Notes (Signed)
Subjective:  Patient ID: [name] [name], female    DOB: [birth year]  Age: 71 y.o. MRN: [id number]  CC: Annual Exam (No concerns. )   HPI [name] presents for well exam  Outpatient Medications Prior to Visit  Medication Sig Dispense Refill   albuterol (VENTOLIN HFA) 108 (90 Base) MCG/ACT inhaler Inhale 1-2 puffs into the lungs every 6 (six) hours as needed for wheezing or shortness of breath.     b complex vitamins tablet Take 1 tablet by mouth daily. 100 tablet 3   BREO ELLIPTA 200-25 MCG/INH AEPB Inhale 1 puff into the lungs daily.  6   Cholecalciferol (VITAMIN D3) 50 MCG (2000 UT) capsule Take 1 capsule (2,000 Units total) by mouth daily. 100 capsule 3   loratadine (CLARITIN) 10 MG tablet Take 10 mg by mouth daily.       famotidine (PEPCID) 40 MG tablet Take 1 tablet (40 mg total) by mouth daily. 90 tablet 3   fluticasone (FLONASE) 50 MCG/ACT nasal spray Place 2 sprays into both nostrils daily. 16 g 6   losartan (COZAAR) 50 MG tablet Take 1 tablet (50 mg total) by mouth daily. 90 tablet 3   atorvastatin (LIPITOR) 20 MG tablet Take 1 tablet (20 mg total) by mouth daily. 90 tablet 3   No facility-administered medications prior to visit.    ROS: Review of Systems  Constitutional:  Negative for activity change, appetite change, chills, fatigue and unexpected weight change.  HENT:  Negative for congestion, mouth sores and sinus pressure.   Eyes:  Negative for visual disturbance.  Respiratory:  Negative for cough and chest tightness.   Gastrointestinal:  Negative for abdominal pain and nausea.  Genitourinary:  Negative for difficulty urinating, frequency and vaginal pain.  Musculoskeletal:  Negative for back pain and gait problem.  Skin:  Negative for pallor and rash.  Neurological:  Negative for dizziness, tremors, weakness, numbness and headaches.  Psychiatric/Behavioral:  Negative for confusion and sleep disturbance.     Objective:  BP 112/70    Pulse 69   Temp 98.9 F (37.2 C) (Oral)   Ht 5\' 6"  (1.676 m)   Wt 155 lb 9.6 oz (70.6 kg)   SpO2 99%   BMI 25.11 kg/m   BP Readings from Last 3 Encounters:  04/21/22 112/70  01/29/22 (!) 152/74  04/18/21 130/78    Wt Readings from Last 3 Encounters:  04/21/22 155 lb 9.6 oz (70.6 kg)  01/29/22 155 lb (70.3 kg)  04/18/21 157 lb 9.6 oz (71.5 kg)    Physical Exam Constitutional:      General: She is not in acute distress.    Appearance: She is well-developed.  HENT:     Head: Normocephalic.     Right Ear: External ear normal.     Left Ear: External ear normal.     Nose: Nose normal.  Eyes:     General:        Right eye: No discharge.        Left eye: No discharge.     Conjunctiva/sclera: Conjunctivae normal.     Pupils: Pupils are equal, round, and reactive to light.  Neck:     Thyroid: No thyromegaly.     Vascular: No JVD.     Trachea: No tracheal deviation.  Cardiovascular:     Rate and Rhythm: Normal rate and regular rhythm.     Heart sounds: Normal heart sounds.  Pulmonary:     Effort: No respiratory distress.  Breath sounds: No stridor. No wheezing.  Abdominal:     General: Bowel sounds are normal. There is no distension.     Palpations: Abdomen is soft. There is no mass.     Tenderness: There is no abdominal tenderness. There is no guarding or rebound.  Musculoskeletal:        General: No tenderness.     Cervical back: Normal range of motion and neck supple. No rigidity.  Lymphadenopathy:     Cervical: No cervical adenopathy.  Skin:    Findings: No erythema or rash.  Neurological:     Cranial Nerves: No cranial nerve deficit.     Motor: No abnormal muscle tone.     Coordination: Coordination normal.     Deep Tendon Reflexes: Reflexes normal.  Psychiatric:        Behavior: Behavior normal.        Thought Content: Thought content normal.        Judgment: Judgment normal.     Lab Results  Component Value Date   WBC 7.5 04/18/2021   HGB 14.1  04/18/2021   HCT 42.5 04/18/2021   PLT 323.0 04/18/2021   GLUCOSE 98 04/18/2021   CHOL 184 04/18/2021   TRIG 75.0 04/18/2021   HDL 84.40 04/18/2021   LDLCALC 85 04/18/2021   ALT 22 04/18/2021   AST 23 04/18/2021   NA 142 04/18/2021   K 4.3 04/18/2021   CL 105 04/18/2021   CREATININE 0.71 04/18/2021   BUN 12 04/18/2021   CO2 27 04/18/2021   TSH 1.28 04/18/2021    No results found.  Assessment & Plan:   Problem List Items Addressed This Visit     BPV (benign positional vertigo)    Meclizine prn      HTN (hypertension)   Relevant Medications   losartan (COZAAR) 50 MG tablet   atorvastatin (LIPITOR) 20 MG tablet   Other Relevant Orders   TSH   Urinalysis   CBC with Differential/Platelet   Lipid panel   Comprehensive metabolic panel   INSOMNIA   Well adult exam - Primary    We discussed age appropriate health related issues, including available/recomended screening tests and vaccinations. We discussed a need for adhering to healthy diet and exercise. Labs were ordered to be later reviewed . All questions were answered.  GYN - Dr Chestine Spore, Mammo q 12 mo  Colon due in 2020 Dr Marina Goodell Cologuard 2017, 2021 due 2024 A  cardiac CT scan for calcium scoring done. Shingrix, Prevnar 21, RSV -declined      Relevant Orders   TSH   Urinalysis   CBC with Differential/Platelet   Lipid panel   Comprehensive metabolic panel      Meds ordered this encounter  Medications   losartan (COZAAR) 50 MG tablet    Sig: Take 1 tablet (50 mg total) by mouth daily.    Dispense:  90 tablet    Refill:  3   fluticasone (FLONASE) 50 MCG/ACT nasal spray    Sig: Place 2 sprays into both nostrils daily.    Dispense:  16 g    Refill:  6   famotidine (PEPCID) 40 MG tablet    Sig: Take 1 tablet (40 mg total) by mouth daily.    Dispense:  90 tablet    Refill:  3   atorvastatin (LIPITOR) 20 MG tablet    Sig: Take 1 tablet (20 mg total) by mouth daily.    Dispense:  90 tablet    Refill:  3    meclizine (ANTIVERT) 12.5 MG tablet    Sig: Take 1 tablet (12.5 mg total) by mouth 3 (three) times daily as needed for dizziness.    Dispense:  30 tablet    Refill:  1      Follow-up: Return in about 1 year (around 04/22/2023) for Wellness Exam.  Sonda Primes, MD

## 2022-04-21 NOTE — Assessment & Plan Note (Signed)
We discussed age appropriate health related issues, including available/recomended screening tests and vaccinations. We discussed a need for adhering to healthy diet and exercise. Labs were ordered to be later reviewed . All questions were answered.  GYN - Dr Chestine Spore, Mammo q 12 mo  Colon due in 2020 Dr Marina Goodell Cologuard 2017, 2021 due 2024 A  cardiac CT scan for calcium scoring done. Shingrix, Prevnar 21, RSV -declined

## 2022-04-22 ENCOUNTER — Encounter: Payer: Self-pay | Admitting: Internal Medicine

## 2022-06-06 ENCOUNTER — Other Ambulatory Visit: Payer: Self-pay | Admitting: Obstetrics

## 2022-06-06 ENCOUNTER — Other Ambulatory Visit: Payer: Self-pay | Admitting: Internal Medicine

## 2022-06-06 DIAGNOSIS — Z1231 Encounter for screening mammogram for malignant neoplasm of breast: Secondary | ICD-10-CM

## 2022-07-17 ENCOUNTER — Encounter: Payer: Self-pay | Admitting: Internal Medicine

## 2022-07-21 ENCOUNTER — Ambulatory Visit: Payer: Medicare PPO

## 2022-07-24 ENCOUNTER — Ambulatory Visit: Payer: Medicare PPO

## 2022-08-04 ENCOUNTER — Ambulatory Visit
Admission: RE | Admit: 2022-08-04 | Discharge: 2022-08-04 | Disposition: A | Discharge status: Home / Self Care | Payer: Medicare PPO | Source: Ambulatory Visit | Attending: Internal Medicine | Admitting: Internal Medicine

## 2022-08-04 DIAGNOSIS — Z1231 Encounter for screening mammogram for malignant neoplasm of breast: Secondary | ICD-10-CM

## 2022-08-08 ENCOUNTER — Ambulatory Visit
Admission: RE | Admit: 2022-08-08 | Discharge: 2022-08-08 | Disposition: A | Discharge status: Home / Self Care | Payer: Medicare PPO | Source: Ambulatory Visit | Attending: Emergency Medicine | Admitting: Emergency Medicine

## 2022-08-08 VITALS — BP 129/75 | HR 75 | Temp 97.9°F | Resp 16

## 2022-08-08 DIAGNOSIS — J302 Other seasonal allergic rhinitis: Secondary | ICD-10-CM | POA: Diagnosis not present

## 2022-08-08 DIAGNOSIS — H66001 Acute suppurative otitis media without spontaneous rupture of ear drum, right ear: Secondary | ICD-10-CM | POA: Diagnosis not present

## 2022-08-08 MED ORDER — CEFDINIR 300 MG PO CAPS: 300.0000 mg | ORAL_CAPSULE | Freq: Two times a day (BID) | ORAL | 0 refills | Status: AC

## 2022-08-08 NOTE — ED Triage Notes (Signed)
Patient presents to [facility] for bilateral ear fullness x 3 days. Treating with tylenol.   Denies fever or ear drainage.

## 2022-08-08 NOTE — Discharge Instructions (Addendum)
I believe that your seasonal allergies have caused you to have an excess buildup of fluid in your sinuses which is draining to your ears resulting in a nice warm environment for bacteria to breeding grow.  At this time, I recommend that we treat you for bacterial infection in your right inner ear.  Please be sure that you continue using Claritin and Flonase daily throughout the winter season to prevent future infections.  Please read below to learn more about the medications, dosages and frequencies that I recommend to help alleviate your symptoms and to get you feeling better soon:  Omnicef (cefdinir):  1 capsule twice daily for 10 days, you can take it with or without food.  This antibiotic can cause upset stomach, this will resolve once antibiotics are complete.  You are welcome to use a probiotic, eat yogurt, take Imodium while taking this medication.  Please avoid other systemic medications such as Maalox, Pepto-Bismol or milk of magnesia as they can interfere with your body's ability to absorb the antibiotics.       Advil, Motrin (ibuprofen): This is a good anti-inflammatory medication which addresses aches, pains and inflammation of the upper airways that causes sinus and nasal congestion as well as in the lower airways which makes your cough feel tight and sometimes burn.  I recommend that you take between 400 to 600 mg every 6-8 hours as needed.      Tylenol (acetaminophen): This is a good fever reducer.  If your body temperature rises above 101.5 as measured with a thermometer, it is recommended that you take 1,000 mg every 8 hours until your temperature falls below 101.5, please not take more than 3,000 mg of acetaminophen either as a separate medication or as in ingredient in an over-the-counter cold/flu preparation within a 24-hour period.      Please follow-up within the next 7-10 days either with your primary care provider or urgent care if your symptoms do not significantly improve.     Thank you for visiting urgent care today.

## 2022-08-10 NOTE — ED Provider Notes (Signed)
[facility]    CSN: [id number] Arrival date & time: 08/08/22  1339    HISTORY   Chief Complaint  Patient presents with   Ear Fullness    Entered by patient   HPI [name] [name] is a pleasant, 71 y.o. female who presents to urgent care today. Patient complains of fullness in both of her ears for the past 3 days.  Patient states she has not experienced hearing loss, fever or drainage from her ears.  Patient states she has been taking Tylenol without relief of her symptoms.  Patient denies history of recurrent ear infections.  Patient denies sore throat, cough, nasal congestion, body aches, chills.  Patient denies known sick contacts.  The history is provided by the patient.   Past Medical History:  Diagnosis Date   Allergic rhinitis    Anemia    NOS   Asthma    Carotid stenosis    mild   GERD (gastroesophageal reflux disease)    Dr. Marina Goodell   Hyperlipidemia    Migraine    Osteopenia    Patient Active Problem List   Diagnosis Date Noted   BPV (benign positional vertigo) 04/21/2022   Tick bite 01/09/2021   Malaise 01/09/2021   Acute sinusitis 08/16/2020   Elevated coronary artery calcium score 06/30/2019   Coronary atherosclerosis due to lipid rich plaque 06/10/2019   Laryngitis 07/22/2018   Finger laceration 07/13/2018   Hypercholesterolemia 11/16/2017   Osteopenia 11/16/2017   Thoracic back pain 11/14/2014   Seromucinous otitis media 07/03/2011   HTN (hypertension) 07/03/2011   Well adult exam 02/27/2011   CONSTIPATION, CHRONIC 10/28/2010   NAUSEA 10/28/2010   ABDOMINAL PAIN, EPIGASTRIC 10/28/2010   BRONCHITIS, ACUTE 10/16/2009   UPPER RESPIRATORY INFECTION (URI) 07/21/2008   VERTIGO 07/21/2008   CONTACT DERMATITIS&OTHER ECZEMA DUE UNSPEC CAUSE 06/15/2008   OTHER SPECIFIED PRURITIC CONDITIONS 06/15/2008   INSOMNIA 05/08/2008   BURN OF UNSPECIFIED DEGREE OF PALM OF HAND 05/08/2008   CERVICALGIA 03/30/2008   HEMORRHOIDS 11/03/2007    ESOPHAGEAL STRICTURE 11/03/2007   OSTEOARTHRITIS 11/03/2007   HEADACHE 11/03/2007   ROTATOR CUFF SPRAIN AND STRAIN 11/03/2007   Unspecified otitis media 10/08/2007   ANEMIA-NOS 10/04/2007   ALLERGIC RHINITIS 10/04/2007   GERD 08/31/2007   Dyslipidemia 07/27/2007   CAROTID ARTERY STENOSIS 07/27/2007   Asthma 07/27/2007   OSTEOPENIA 07/27/2007   Past Surgical History:  Procedure Laterality Date   TUBAL LIGATION     OB History   No obstetric history on file.    Home Medications    Prior to Admission medications   Medication Sig Start Date End Date Taking? Authorizing Provider  cefdinir (OMNICEF) 300 MG capsule Take 1 capsule (300 mg total) by mouth 2 (two) times daily for 10 days. 08/08/22 08/18/22 Yes Theadora Rama Scales, PA-C  albuterol (VENTOLIN HFA) 108 (90 Base) MCG/ACT inhaler Inhale 1-2 puffs into the lungs every 6 (six) hours as needed for wheezing or shortness of breath.    [provider]  atorvastatin (LIPITOR) 20 MG tablet Take 1 tablet (20 mg total) by mouth daily. 04/21/22 04/21/23  Plotnikov, Georgina Quint, MD  b complex vitamins tablet Take 1 tablet by mouth daily. 04/12/19   Plotnikov, Georgina Quint, MD  BREO ELLIPTA 200-25 MCG/INH AEPB Inhale 1 puff into the lungs daily. 02/15/16   [provider]  Cholecalciferol (VITAMIN D3) 50 MCG (2000 UT) capsule Take 1 capsule (2,000 Units total) by mouth daily. 04/17/20   Plotnikov, Georgina Quint, MD  famotidine (  PEPCID) 40 MG tablet Take 1 tablet (40 mg total) by mouth daily. 04/21/22   Plotnikov, Georgina Quint, MD  fluticasone (FLONASE) 50 MCG/ACT nasal spray Place 2 sprays into both nostrils daily. 04/21/22   Plotnikov, Georgina Quint, MD  loratadine (CLARITIN) 10 MG tablet Take 10 mg by mouth daily.      [provider]  losartan (COZAAR) 50 MG tablet Take 1 tablet (50 mg total) by mouth daily. 04/21/22   Plotnikov, Georgina Quint, MD  meclizine (ANTIVERT) 12.5 MG tablet Take 1 tablet (12.5 mg total) by mouth 3 (three) times  daily as needed for dizziness. 04/21/22 04/21/23  Plotnikov, Georgina Quint, MD    Family History Family History  Problem Relation Age of Onset   Cancer Father 9       Chest   Hypertension Mother    Social History Social History   Tobacco Use   Smoking status: Never   Smokeless tobacco: Never  Substance Use Topics   Alcohol use: No   Drug use: No   Allergies   Alendronate sodium  Review of Systems Review of Systems Pertinent findings revealed after performing a 14 point review of systems has been noted in the history of present illness.  Physical Exam Triage Vital Signs ED Triage Vitals  Enc Vitals Group     BP 07/05/21 0827 (!) 147/82     Pulse Rate 07/05/21 0827 72     Resp 07/05/21 0827 18     Temp 07/05/21 0827 98.3 F (36.8 C)     Temp Source 07/05/21 0827 Oral     SpO2 07/05/21 0827 98 %     Weight --      Height --      Head Circumference --      Peak Flow --      Pain Score 07/05/21 0826 5     Pain Loc --      Pain Edu? --      Excl. in GC? --   No data found.  Updated Vital Signs BP 129/75 (BP Location: Right Arm)   Pulse 75   Temp 97.9 F (36.6 C) (Oral)   Resp 16   SpO2 95%   Physical Exam Vitals and nursing note reviewed.  Constitutional:      General: She is not in acute distress.    Appearance: Normal appearance. She is not ill-appearing.  HENT:     Head: Normocephalic and atraumatic.     Salivary Glands: Right salivary gland is not diffusely enlarged or tender. Left salivary gland is not diffusely enlarged or tender.     Right Ear: Ear canal and external ear normal. No drainage. A middle ear effusion is present. There is no impacted cerumen. Tympanic membrane is injected, erythematous, retracted and bulging.     Left Ear: Ear canal and external ear normal. No drainage.  No middle ear effusion. There is no impacted cerumen. Tympanic membrane is bulging. Tympanic membrane is not injected, erythematous or retracted.     Nose: Rhinorrhea present.  No nasal deformity, septal deviation, signs of injury, nasal tenderness, mucosal edema or congestion. Rhinorrhea is clear.     Right Nostril: Occlusion present. No foreign body, epistaxis or septal hematoma.     Left Nostril: Occlusion present. No foreign body, epistaxis or septal hematoma.     Right Turbinates: Enlarged, swollen and pale.     Left Turbinates: Enlarged, swollen and pale.     Right Sinus: No maxillary sinus tenderness or frontal  sinus tenderness.     Left Sinus: No maxillary sinus tenderness or frontal sinus tenderness.     Mouth/Throat:     Lips: Pink. No lesions.     Mouth: Mucous membranes are moist. No oral lesions.     Pharynx: Oropharynx is clear. Uvula midline. No posterior oropharyngeal erythema or uvula swelling.     Tonsils: No tonsillar exudate. 0 on the right. 0 on the left.     Comments: Postnasal drip Eyes:     General: Lids are normal.        Right eye: No discharge.        Left eye: No discharge.     Extraocular Movements: Extraocular movements intact.     Conjunctiva/sclera: Conjunctivae normal.     Right eye: Right conjunctiva is not injected.     Left eye: Left conjunctiva is not injected.  Neck:     Trachea: Trachea and phonation normal.  Cardiovascular:     Rate and Rhythm: Normal rate and regular rhythm.     Pulses: Normal pulses.     Heart sounds: Normal heart sounds. No murmur heard.    No friction rub. No gallop.  Pulmonary:     Effort: Pulmonary effort is normal. No accessory muscle usage, prolonged expiration or respiratory distress.     Breath sounds: Normal breath sounds. No stridor, decreased air movement or transmitted upper airway sounds. No decreased breath sounds, wheezing, rhonchi or rales.  Chest:     Chest wall: No tenderness.  Musculoskeletal:        General: Normal range of motion.     Cervical back: Normal range of motion and neck supple. Normal range of motion.  Lymphadenopathy:     Cervical: No cervical adenopathy.   Skin:    General: Skin is warm and dry.     Findings: No erythema or rash.  Neurological:     General: No focal deficit present.     Mental Status: She is alert and oriented to person, place, and time.  Psychiatric:        Mood and Affect: Mood normal.        Behavior: Behavior normal.     Visual Acuity Right Eye Distance:   Left Eye Distance:   Bilateral Distance:    Right Eye Near:   Left Eye Near:    Bilateral Near:     UC Couse / Diagnostics / Procedures:     Radiology No results found.  Procedures Procedures (including critical care time) EKG  Pending results:  Labs Reviewed - No data to display  Medications Ordered in UC: Medications - No data to display  UC Diagnoses / Final Clinical Impressions(s)   I have reviewed the triage vital signs and the nursing notes.  Pertinent labs & imaging results that were available during my care of the patient were reviewed by me and considered in my medical decision making (see chart for details).    Final diagnoses:  Acute suppurative otitis media of right ear without spontaneous rupture of tympanic membrane, recurrence not specified  Seasonal allergic rhinitis, unspecified trigger   Patient advised to continue allergy medications as have previously been prescribed.  Patient provided with a 10-day course of cefdinir for empiric treatment of presumed bacterial otitis media in her right ear.  Conservative care recommended.  Return precautions advised.  Please see discharge instructions below for further details of plan of care.  ED Prescriptions     Medication Sig Dispense Auth. Provider  cefdinir (OMNICEF) 300 MG capsule Take 1 capsule (300 mg total) by mouth 2 (two) times daily for 10 days. 20 capsule Theadora Rama Scales, PA-C      PDMP not reviewed this encounter.  Disposition Upon Discharge:  Condition: stable for discharge home Home: take medications as prescribed; routine discharge instructions as  discussed; follow up as advised.  Patient presented with an acute illness with associated systemic symptoms and significant discomfort requiring urgent management. In my opinion, this is a condition that a prudent lay person (someone who possesses an average knowledge of health and medicine) may potentially expect to result in complications if not addressed urgently such as respiratory distress, impairment of bodily function or dysfunction of bodily organs.   Routine symptom specific, illness specific and/or disease specific instructions were discussed with the patient and/or caregiver at length.   As such, the patient has been evaluated and assessed, work-up was performed and treatment was provided in alignment with urgent care protocols and evidence based medicine.  Patient/parent/caregiver has been advised that the patient may require follow up for further testing and treatment if the symptoms continue in spite of treatment, as clinically indicated and appropriate.  If the patient was tested for COVID-19, Influenza and/or RSV, then the patient/parent/guardian was advised to isolate at home pending the results of his/her diagnostic coronavirus test and potentially longer if they're positive. I have also advised pt that if his/her COVID-19 test returns positive, it's recommended to self-isolate for at least 10 days after symptoms first appeared AND until fever-free for 24 hours without fever reducer AND other symptoms have improved or resolved. Discussed self-isolation recommendations as well as instructions for household member/close contacts as per the [facility] and [place] [facility], and also gave patient the COVID packet with this information.  Patient/parent/caregiver has been advised to return to the [facility] or PCP in 3-5 days if no better; to PCP or the Emergency Department if new signs and symptoms develop, or if the current signs or symptoms continue to change or worsen for further workup, evaluation and treatment  as clinically indicated and appropriate  The patient will follow up with their current PCP if and as advised. If the patient does not currently have a PCP we will assist them in obtaining one.   The patient may need specialty follow up if the symptoms continue, in spite of conservative treatment and management, for further workup, evaluation, consultation and treatment as clinically indicated and appropriate.  Patient/parent/caregiver verbalized understanding and agreement of plan as discussed.  All questions were addressed during visit.  Please see discharge instructions below for further details of plan.  Discharge Instructions:   Discharge Instructions      I believe that your seasonal allergies have caused you to have an excess buildup of fluid in your sinuses which is draining to your ears resulting in a nice warm environment for bacteria to breeding grow.  At this time, I recommend that we treat you for bacterial infection in your right inner ear.  Please be sure that you continue using Claritin and Flonase daily throughout the winter season to prevent future infections.  Please read below to learn more about the medications, dosages and frequencies that I recommend to help alleviate your symptoms and to get you feeling better soon:  Omnicef (cefdinir):  1 capsule twice daily for 10 days, you can take it with or without food.  This antibiotic can cause upset stomach, this will resolve once antibiotics are complete.  You are welcome  to use a probiotic, eat yogurt, take Imodium while taking this medication.  Please avoid other systemic medications such as Maalox, Pepto-Bismol or milk of magnesia as they can interfere with your body's ability to absorb the antibiotics.       Advil, Motrin (ibuprofen): This is a good anti-inflammatory medication which addresses aches, pains and inflammation of the upper airways that causes sinus and nasal congestion as well as in the lower airways which makes  your cough feel tight and sometimes burn.  I recommend that you take between 400 to 600 mg every 6-8 hours as needed.      Tylenol (acetaminophen): This is a good fever reducer.  If your body temperature rises above 101.5 as measured with a thermometer, it is recommended that you take 1,000 mg every 8 hours until your temperature falls below 101.5, please not take more than 3,000 mg of acetaminophen either as a separate medication or as in ingredient in an over-the-counter cold/flu preparation within a 24-hour period.      Please follow-up within the next 7-10 days either with your primary care provider or urgent care if your symptoms do not significantly improve.    Thank you for visiting urgent care today.      This office note has been dictated using Teaching laboratory technicianDragon speech recognition software.  Unfortunately, this method of dictation can sometimes lead to typographical or grammatical errors.  I apologize for your inconvenience in advance if this occurs.  Please do not hesitate to reach out to me if clarification is needed.      Theadora RamaMorgan, Jennine Peddy Scales, [place]PA-C 08/10/22 1908

## 2022-08-27 ENCOUNTER — Ambulatory Visit
Admission: RE | Admit: 2022-08-27 | Discharge: 2022-08-27 | Disposition: A | Discharge status: Home / Self Care | Payer: Medicare PPO | Source: Ambulatory Visit | Attending: Emergency Medicine | Admitting: Emergency Medicine

## 2022-08-27 VITALS — BP 127/71 | HR 74 | Temp 97.9°F | Resp 16

## 2022-08-27 DIAGNOSIS — Z20828 Contact with and (suspected) exposure to other viral communicable diseases: Secondary | ICD-10-CM | POA: Diagnosis not present

## 2022-08-27 LAB — POCT INFLUENZA A/B
Influenza A, POC: NEGATIVE
Influenza B, POC: NEGATIVE

## 2022-08-27 NOTE — Discharge Instructions (Addendum)
Your influenza test today was negative.  No further testing is needed.  At this time your breath sounds are clear and I do not have any concern for pneumonia or asthma exacerbation.  Your ears also appear well, no concern for viral or bacterial infection at this time.  Please continue using your daily inhaler as you have been and use your rescue inhaler generously.  Please let us know if your symptoms worsen.  Thank you for visiting urgent care today.

## 2022-08-27 NOTE — ED Provider Notes (Signed)
[facility]    CSN: [id number] Arrival date & time: 08/27/22  1256    HISTORY   Chief Complaint  Patient presents with   Cough    Entered by patient   Nasal Congestion   HPI [name] [name] is a pleasant, 71 y.o. female who presents to urgent care today. Patient complains of a 3 day history of cough and nasal congestion.  Patient states that her husband tested positive for influenza 4 days ago.  Patient states she has not tried medication to alleviate her symptoms.  Patient reports a history of asthma, states that her husband insisted that she come in today for evaluation.  Patient denies shortness of breath, states overall she feels better than she did 2 days ago.  Patient's vital signs are normal on arrival today.  Patient denies nausea, vomiting, diarrhea, sore throat, headache, body aches, chills, fever.  The history is provided by the patient.   Past Medical History:  Diagnosis Date   Allergic rhinitis    Anemia    NOS   Asthma    Carotid stenosis    mild   GERD (gastroesophageal reflux disease)    Dr. Marina Goodell   Hyperlipidemia    Migraine    Osteopenia    Patient Active Problem List   Diagnosis Date Noted   BPV (benign positional vertigo) 04/21/2022   Tick bite 01/09/2021   Malaise 01/09/2021   Acute sinusitis 08/16/2020   Elevated coronary artery calcium score 06/30/2019   Coronary atherosclerosis due to lipid rich plaque 06/10/2019   Laryngitis 07/22/2018   Finger laceration 07/13/2018   Hypercholesterolemia 11/16/2017   Osteopenia 11/16/2017   Thoracic back pain 11/14/2014   Seromucinous otitis media 07/03/2011   HTN (hypertension) 07/03/2011   Well adult exam 02/27/2011   CONSTIPATION, CHRONIC 10/28/2010   NAUSEA 10/28/2010   ABDOMINAL PAIN, EPIGASTRIC 10/28/2010   BRONCHITIS, ACUTE 10/16/2009   UPPER RESPIRATORY INFECTION (URI) 07/21/2008   VERTIGO 07/21/2008   CONTACT DERMATITIS&OTHER ECZEMA DUE UNSPEC CAUSE 06/15/2008   OTHER  SPECIFIED PRURITIC CONDITIONS 06/15/2008   INSOMNIA 05/08/2008   BURN OF UNSPECIFIED DEGREE OF PALM OF HAND 05/08/2008   CERVICALGIA 03/30/2008   HEMORRHOIDS 11/03/2007   ESOPHAGEAL STRICTURE 11/03/2007   OSTEOARTHRITIS 11/03/2007   HEADACHE 11/03/2007   ROTATOR CUFF SPRAIN AND STRAIN 11/03/2007   Unspecified otitis media 10/08/2007   ANEMIA-NOS 10/04/2007   ALLERGIC RHINITIS 10/04/2007   GERD 08/31/2007   Dyslipidemia 07/27/2007   CAROTID ARTERY STENOSIS 07/27/2007   Asthma 07/27/2007   OSTEOPENIA 07/27/2007   Past Surgical History:  Procedure Laterality Date   TUBAL LIGATION     OB History   No obstetric history on file.    Home Medications    Prior to Admission medications   Medication Sig Start Date End Date Taking? Authorizing Provider  albuterol (VENTOLIN HFA) 108 (90 Base) MCG/ACT inhaler Inhale 1-2 puffs into the lungs every 6 (six) hours as needed for wheezing or shortness of breath.    [provider]  atorvastatin (LIPITOR) 20 MG tablet Take 1 tablet (20 mg total) by mouth daily. 04/21/22 04/21/23  Plotnikov, Georgina Quint, MD  b complex vitamins tablet Take 1 tablet by mouth daily. 04/12/19   Plotnikov, Georgina Quint, MD  BREO ELLIPTA 200-25 MCG/INH AEPB Inhale 1 puff into the lungs daily. 02/15/16   [provider]  Cholecalciferol (VITAMIN D3) 50 MCG (2000 UT) capsule Take 1 capsule (2,000 Units total) by mouth daily. 04/17/20   Plotnikov, Georgina Quint,  MD  famotidine (PEPCID) 40 MG tablet Take 1 tablet (40 mg total) by mouth daily. 04/21/22   Plotnikov, Georgina Quint, MD  fluticasone (FLONASE) 50 MCG/ACT nasal spray Place 2 sprays into both nostrils daily. 04/21/22   Plotnikov, Georgina Quint, MD  loratadine (CLARITIN) 10 MG tablet Take 10 mg by mouth daily.      [provider]  losartan (COZAAR) 50 MG tablet Take 1 tablet (50 mg total) by mouth daily. 04/21/22   Plotnikov, Georgina Quint, MD  meclizine (ANTIVERT) 12.5 MG tablet Take 1 tablet (12.5 mg total) by mouth 3  (three) times daily as needed for dizziness. 04/21/22 04/21/23  Plotnikov, Georgina Quint, MD    Family History Family History  Problem Relation Age of Onset   Cancer Father 67       Chest   Hypertension Mother    Social History Social History   Tobacco Use   Smoking status: Never   Smokeless tobacco: Never  Substance Use Topics   Alcohol use: No   Drug use: No   Allergies   Amoxicillin-pot clavulanate and Alendronate sodium  Review of Systems Review of Systems Pertinent findings revealed after performing a 14 point review of systems has been noted in the history of present illness.  Physical Exam Triage Vital Signs ED Triage Vitals  Enc Vitals Group     BP 07/05/21 0827 (!) 147/82     Pulse Rate 07/05/21 0827 72     Resp 07/05/21 0827 18     Temp 07/05/21 0827 98.3 F (36.8 C)     Temp Source 07/05/21 0827 Oral     SpO2 07/05/21 0827 98 %     Weight --      Height --      Head Circumference --      Peak Flow --      Pain Score 07/05/21 0826 5     Pain Loc --      Pain Edu? --      Excl. in GC? --   No data found.  Updated Vital Signs BP 127/71 (BP Location: Right Arm)   Pulse 74   Temp 97.9 F (36.6 C) (Oral)   Resp 16   SpO2 98%   Physical Exam Vitals and nursing note reviewed.  Constitutional:      General: She is not in acute distress.    Appearance: Normal appearance. She is not ill-appearing.  HENT:     Head: Normocephalic and atraumatic.     Salivary Glands: Right salivary gland is not diffusely enlarged or tender. Left salivary gland is not diffusely enlarged or tender.     Right Ear: Tympanic membrane, ear canal and external ear normal. No drainage. No middle ear effusion. There is no impacted cerumen. Tympanic membrane is not erythematous or bulging.     Left Ear: Tympanic membrane, ear canal and external ear normal. No drainage.  No middle ear effusion. There is no impacted cerumen. Tympanic membrane is not erythematous or bulging.     Nose: Nose  normal. No nasal deformity, septal deviation, mucosal edema, congestion or rhinorrhea.     Right Turbinates: Not enlarged, swollen or pale.     Left Turbinates: Not enlarged, swollen or pale.     Right Sinus: No maxillary sinus tenderness or frontal sinus tenderness.     Left Sinus: No maxillary sinus tenderness or frontal sinus tenderness.     Mouth/Throat:     Lips: Pink. No lesions.  Mouth: Mucous membranes are moist. No oral lesions.     Pharynx: Oropharynx is clear. Uvula midline. No posterior oropharyngeal erythema or uvula swelling.     Tonsils: No tonsillar exudate. 0 on the right. 0 on the left.  Eyes:     General: Lids are normal.        Right eye: No discharge.        Left eye: No discharge.     Extraocular Movements: Extraocular movements intact.     Conjunctiva/sclera: Conjunctivae normal.     Right eye: Right conjunctiva is not injected.     Left eye: Left conjunctiva is not injected.  Neck:     Trachea: Trachea and phonation normal.  Cardiovascular:     Rate and Rhythm: Normal rate and regular rhythm.     Pulses: Normal pulses.     Heart sounds: Normal heart sounds. No murmur heard.    No friction rub. No gallop.  Pulmonary:     Effort: Pulmonary effort is normal. No accessory muscle usage, prolonged expiration or respiratory distress.     Breath sounds: Normal breath sounds. No stridor, decreased air movement or transmitted upper airway sounds. No decreased breath sounds, wheezing, rhonchi or rales.  Chest:     Chest wall: No tenderness.  Musculoskeletal:        General: Normal range of motion.     Cervical back: Normal range of motion and neck supple. Normal range of motion.  Lymphadenopathy:     Cervical: No cervical adenopathy.  Skin:    General: Skin is warm and dry.     Findings: No erythema or rash.  Neurological:     General: No focal deficit present.     Mental Status: She is alert and oriented to person, place, and time.  Psychiatric:        Mood  and Affect: Mood normal.        Behavior: Behavior normal.     Visual Acuity Right Eye Distance:   Left Eye Distance:   Bilateral Distance:    Right Eye Near:   Left Eye Near:    Bilateral Near:     UC Couse / Diagnostics / Procedures:     Radiology No results found.  Procedures Procedures (including critical care time) EKG  Pending results:  Labs Reviewed  POCT INFLUENZA A/B    Medications Ordered in UC: Medications - No data to display  UC Diagnoses / Final Clinical Impressions(s)   I have reviewed the triage vital signs and the nursing notes.  Pertinent labs & imaging results that were available during my care of the patient were reviewed by me and considered in my medical decision making (see chart for details).    Final diagnoses:  Exposure to influenza   Fluency test was negative.  Patient was advised to continue her current asthma medication and follow-up as needed. Please see discharge instructions below for further details of plan of care as provided to patient. ED Prescriptions   None    PDMP not reviewed this encounter.  Disposition Upon Discharge:  Condition: stable for discharge home Home: take medications as prescribed; routine discharge instructions as discussed; follow up as advised.  Patient presented with an acute illness with associated systemic symptoms and significant discomfort requiring urgent management. In my opinion, this is a condition that a prudent lay person (someone who possesses an average knowledge of health and medicine) may potentially expect to result in complications if not addressed urgently such as  respiratory distress, impairment of bodily function or dysfunction of bodily organs.   Routine symptom specific, illness specific and/or disease specific instructions were discussed with the patient and/or caregiver at length.   As such, the patient has been evaluated and assessed, work-up was performed and treatment was  provided in alignment with urgent care protocols and evidence based medicine.  Patient/parent/caregiver has been advised that the patient may require follow up for further testing and treatment if the symptoms continue in spite of treatment, as clinically indicated and appropriate.  If the patient was tested for COVID-19, Influenza and/or RSV, then the patient/parent/guardian was advised to isolate at home pending the results of his/her diagnostic coronavirus test and potentially longer if they're positive. I have also advised pt that if his/her COVID-19 test returns positive, it's recommended to self-isolate for at least 10 days after symptoms first appeared AND until fever-free for 24 hours without fever reducer AND other symptoms have improved or resolved. Discussed self-isolation recommendations as well as instructions for household member/close contacts as per the [facility] and [place] DH[facility], and also gave patient the COVID packet with this information.  Patient/parent/caregiver has been advised [facility] in 3-5 days if no better; to PCP or the Emergency Department if new signs and symptoms develop, or if the current signs or symptoms continue to change or worsen for further workup, evaluation and treatment as clinically indicated and appropriate  The patient will follow up with their current PCP if and as advised. If the patient does not currently have a PCP we will assist them in obtaining one.   The patient may need specialty follow up if the symptoms continue, in spite of conservative treatment and management, for further workup, evaluation, consultation and treatment as clinically indicated and appropriate.  Patient/parent/caregiver verbalized understanding and agreement of plan as discussed.  All questions were addressed during visit.  Please see discharge instructions below for further details of plan.  Discharge Instructions:   Discharge Instructions      Your influenza test  today was negative.  No further testing is needed.  At this time your breath sounds are clear and I do not have any concern for pneumonia or asthma exacerbation.  Your ears also appear well, no concern for viral or bacterial infection at this time.  Please continue using your daily inhaler as you have been and use your rescue inhaler generously.  Please let us know if your symptoms worsen.  Thank you for visiting urgent care today.      This office note has been dictated using [employer] laboratory technicianDragon speech recognition software.  Unfortunately, this method of dictation can sometimes lead to typographical or grammatical errors.  I apologize for your inconvenience in advance if this occurs.  Please do not hesitate to reach out to me if clarification is needed.      Theadora RamaMorgan, Eydan Chianese Scales, PA-C 08/27/22 1430

## 2022-08-27 NOTE — ED Triage Notes (Addendum)
Pts husband tested positive for the flu earlier this week, and she now has a cough and congestion.   Home interventions: none

## 2022-12-08 DIAGNOSIS — H25813 Combined forms of age-related cataract, bilateral: Secondary | ICD-10-CM | POA: Diagnosis not present

## 2022-12-08 DIAGNOSIS — Z01419 Encounter for gynecological examination (general) (routine) without abnormal findings: Secondary | ICD-10-CM | POA: Diagnosis not present

## 2022-12-08 DIAGNOSIS — H524 Presbyopia: Secondary | ICD-10-CM | POA: Diagnosis not present

## 2022-12-08 DIAGNOSIS — H52201 Unspecified astigmatism, right eye: Secondary | ICD-10-CM | POA: Diagnosis not present

## 2023-02-09 DIAGNOSIS — J300 Vasomotor rhinitis: Secondary | ICD-10-CM | POA: Diagnosis not present

## 2023-02-09 DIAGNOSIS — J4530 Mild persistent asthma, uncomplicated: Secondary | ICD-10-CM | POA: Diagnosis not present

## 2023-02-24 ENCOUNTER — Encounter: Payer: Self-pay | Admitting: Internal Medicine

## 2023-02-24 DIAGNOSIS — M160 Bilateral primary osteoarthritis of hip: Secondary | ICD-10-CM

## 2023-02-24 DIAGNOSIS — H8110 Benign paroxysmal vertigo, unspecified ear: Secondary | ICD-10-CM

## 2023-02-24 DIAGNOSIS — M546 Pain in thoracic spine: Secondary | ICD-10-CM

## 2023-02-24 DIAGNOSIS — R42 Dizziness and giddiness: Secondary | ICD-10-CM

## 2023-04-01 DIAGNOSIS — D2262 Melanocytic nevi of left upper limb, including shoulder: Secondary | ICD-10-CM | POA: Diagnosis not present

## 2023-04-01 DIAGNOSIS — L718 Other rosacea: Secondary | ICD-10-CM | POA: Diagnosis not present

## 2023-04-01 DIAGNOSIS — L821 Other seborrheic keratosis: Secondary | ICD-10-CM | POA: Diagnosis not present

## 2023-04-01 DIAGNOSIS — Z85828 Personal history of other malignant neoplasm of skin: Secondary | ICD-10-CM | POA: Diagnosis not present

## 2023-04-08 ENCOUNTER — Encounter (INDEPENDENT_AMBULATORY_CARE_PROVIDER_SITE_OTHER): Payer: Self-pay

## 2023-04-28 ENCOUNTER — Ambulatory Visit (INDEPENDENT_AMBULATORY_CARE_PROVIDER_SITE_OTHER): Payer: Medicare PPO | Admitting: Internal Medicine

## 2023-04-28 ENCOUNTER — Encounter: Payer: Self-pay | Admitting: Internal Medicine

## 2023-04-28 VITALS — BP 112/70 | HR 76 | Temp 98.2°F | Ht 66.0 in | Wt 158.0 lb

## 2023-04-28 DIAGNOSIS — I10 Essential (primary) hypertension: Secondary | ICD-10-CM | POA: Diagnosis not present

## 2023-04-28 DIAGNOSIS — Z1211 Encounter for screening for malignant neoplasm of colon: Secondary | ICD-10-CM | POA: Diagnosis not present

## 2023-04-28 DIAGNOSIS — E785 Hyperlipidemia, unspecified: Secondary | ICD-10-CM

## 2023-04-28 DIAGNOSIS — Z0000 Encounter for general adult medical examination without abnormal findings: Secondary | ICD-10-CM | POA: Diagnosis not present

## 2023-04-28 LAB — CBC WITH DIFFERENTIAL/PLATELET
Basophils Absolute: 0.1 10*3/uL (ref 0.0–0.1)
Basophils Relative: 1.4 % (ref 0.0–3.0)
Eosinophils Absolute: 0.1 10*3/uL (ref 0.0–0.7)
Eosinophils Relative: 1.4 % (ref 0.0–5.0)
HCT: 41 % (ref 36.0–46.0)
Hemoglobin: 13.7 g/dL (ref 12.0–15.0)
Lymphocytes Relative: 29.1 % (ref 12.0–46.0)
Lymphs Abs: 1.7 10*3/uL (ref 0.7–4.0)
MCHC: 33.4 g/dL (ref 30.0–36.0)
MCV: 87.7 fl (ref 78.0–100.0)
Monocytes Absolute: 0.5 10*3/uL (ref 0.1–1.0)
Monocytes Relative: 8.3 % (ref 3.0–12.0)
Neutro Abs: 3.4 10*3/uL (ref 1.4–7.7)
Neutrophils Relative %: 59.8 % (ref 43.0–77.0)
Platelets: 306 10*3/uL (ref 150.0–400.0)
RBC: 4.68 Mil/uL (ref 3.87–5.11)
RDW: 14.1 % (ref 11.5–15.5)
WBC: 5.7 10*3/uL (ref 4.0–10.5)

## 2023-04-28 LAB — URINALYSIS
Bilirubin Urine: NEGATIVE
Ketones, ur: NEGATIVE
Leukocytes,Ua: NEGATIVE
Nitrite: NEGATIVE
Specific Gravity, Urine: 1.01 (ref 1.000–1.030)
Total Protein, Urine: NEGATIVE
Urine Glucose: NEGATIVE
Urobilinogen, UA: 0.2 (ref 0.0–1.0)
pH: 7.5 (ref 5.0–8.0)

## 2023-04-28 LAB — LIPID PANEL
Cholesterol: 185 mg/dL (ref 0–200)
HDL: 68.4 mg/dL (ref >39.00)
LDL Cholesterol: 105 mg/dL — ABNORMAL HIGH (ref 0–99)
NonHDL: 116.58
Total CHOL/HDL Ratio: 3
Triglycerides: 58 mg/dL (ref 0.0–149.0)
VLDL: 11.6 mg/dL (ref 0.0–40.0)

## 2023-04-28 LAB — COMPREHENSIVE METABOLIC PANEL
ALT: 17 U/L (ref 0–35)
AST: 19 U/L (ref 0–37)
Albumin: 4.3 g/dL (ref 3.5–5.2)
Alkaline Phosphatase: 47 U/L (ref 39–117)
BUN: 14 mg/dL (ref 6–23)
CO2: 27 mEq/L (ref 19–32)
Calcium: 9.5 mg/dL (ref 8.4–10.5)
Chloride: 107 mEq/L (ref 96–112)
Creatinine, Ser: 0.7 mg/dL (ref 0.40–1.20)
GFR: 85.99 mL/min (ref >60.00)
Glucose, Bld: 101 mg/dL — ABNORMAL HIGH (ref 70–99)
Potassium: 4 mEq/L (ref 3.5–5.1)
Sodium: 142 mEq/L (ref 135–145)
Total Bilirubin: 0.5 mg/dL (ref 0.2–1.2)
Total Protein: 7.3 g/dL (ref 6.0–8.3)

## 2023-04-28 LAB — TSH: TSH: 1.21 u[IU]/mL (ref 0.35–5.50)

## 2023-04-28 MED ORDER — HYDROXYCHLOROQUINE SULFATE 200 MG PO TABS: ORAL_TABLET | ORAL | 0 refills | Status: DC

## 2023-04-28 MED ORDER — ATORVASTATIN CALCIUM 20 MG PO TABS: 20.0000 mg | ORAL_TABLET | Freq: Every day | ORAL | 3 refills | Status: DC

## 2023-04-28 MED ORDER — LOSARTAN POTASSIUM 50 MG PO TABS
50.0000 mg | ORAL_TABLET | Freq: Every day | ORAL | 3 refills | Status: DC
Start: 2023-04-28 — End: 2024-04-22

## 2023-04-28 MED ORDER — FAMOTIDINE 40 MG PO TABS: 40.0000 mg | ORAL_TABLET | Freq: Every day | ORAL | 3 refills | Status: DC

## 2023-04-28 MED ORDER — HYDROXYCHLOROQUINE SULFATE 200 MG PO TABS: ORAL_TABLET | ORAL | 0 refills | Status: AC

## 2023-04-28 NOTE — Assessment & Plan Note (Signed)
We discussed age appropriate health related issues, including available/recomended screening tests and vaccinations. We discussed a need for adhering to healthy diet and exercise. Labs were ordered to be later reviewed . All questions were answered.  GYN - Dr Chestine Spore, Mammo q 12 mo  Colon due in 2020 Dr Marina Goodell Cologuard 2017, 2021 due 2024 A  cardiac CT scan for calcium scoring done. Shingrix, Prevnar 21, RSV -declined

## 2023-04-28 NOTE — Patient Instructions (Addendum)
For a mild COVID-19 case - take zinc 50 mg a day for 1 week, vitamin C 1000 mg daily for 1 week, vitamin D2 50,000 units weekly for 2 months (unless  taking vitamin D daily already), an antioxidant Quercetin 500 mg twice a day for 1 week (if you can get it quick enough). Take Allegra or Benadryl.  Maintain good oral hydration and take Tylenol for high fever.  Call if problems. Isolate for 5 days, then wear a mask for 5 days per CDC.    [employer] [place] [employer]

## 2023-04-28 NOTE — Assessment & Plan Note (Signed)
On Rx 

## 2023-04-28 NOTE — Progress Notes (Signed)
Subjective:  Patient ID: [name] [name], female    DOB: [birth year]  Age: 72 y.o. MRN: [id number]  CC: No chief complaint on file.   HPI [name] [name] presents for a well exam. F/u HTN, dyslipidemia  Outpatient Medications Prior to Visit  Medication Sig Dispense Refill   albuterol (VENTOLIN HFA) 108 (90 Base) MCG/ACT inhaler Inhale 1-2 puffs into the lungs every 6 (six) hours as needed for wheezing or shortness of breath.     b complex vitamins tablet Take 1 tablet by mouth daily. 100 tablet 3   BREO ELLIPTA 200-25 MCG/INH AEPB Inhale 1 puff into the lungs daily.  6   Cholecalciferol (VITAMIN D3) 50 MCG (2000 UT) capsule Take 1 capsule (2,000 Units total) by mouth daily. 100 capsule 3   fluticasone (FLONASE) 50 MCG/ACT nasal spray Place 2 sprays into both nostrils daily. 16 g 6   loratadine (CLARITIN) 10 MG tablet Take 10 mg by mouth daily.       famotidine (PEPCID) 40 MG tablet Take 1 tablet (40 mg total) by mouth daily. 90 tablet 3   losartan (COZAAR) 50 MG tablet Take 1 tablet (50 mg total) by mouth daily. 90 tablet 3   atorvastatin (LIPITOR) 20 MG tablet Take 1 tablet (20 mg total) by mouth daily. 90 tablet 3   No facility-administered medications prior to visit.    ROS: Review of Systems  Constitutional:  Negative for activity change, appetite change, chills, fatigue and unexpected weight change.  HENT:  Negative for congestion, mouth sores and sinus pressure.   Eyes:  Negative for visual disturbance.  Respiratory:  Negative for cough and chest tightness.   Gastrointestinal:  Negative for abdominal pain and nausea.  Genitourinary:  Negative for difficulty urinating, frequency and vaginal pain.  Musculoskeletal:  Negative for back pain and gait problem.  Skin:  Negative for pallor and rash.  Neurological:  Negative for dizziness, tremors, weakness, numbness and headaches.  Psychiatric/Behavioral:  Negative for confusion and sleep disturbance.      Objective:  BP 112/70 (BP Location: Left Arm, Patient Position: Sitting, Cuff Size: Large)   Pulse 76   Temp 98.2 F (36.8 C) (Oral)   Ht 5\' 6"  (1.676 m)   Wt 158 lb (71.7 kg)   SpO2 95%   BMI 25.50 kg/m   BP Readings from Last 3 Encounters:  04/28/23 112/70  08/27/22 127/71  08/08/22 129/75    Wt Readings from Last 3 Encounters:  04/28/23 158 lb (71.7 kg)  04/21/22 155 lb 9.6 oz (70.6 kg)  01/29/22 155 lb (70.3 kg)    Physical Exam Constitutional:      General: She is not in acute distress.    Appearance: She is well-developed.  HENT:     Head: Normocephalic.     Right Ear: External ear normal.     Left Ear: External ear normal.     Nose: Nose normal.  Eyes:     General:        Right eye: No discharge.        Left eye: No discharge.     Conjunctiva/sclera: Conjunctivae normal.     Pupils: Pupils are equal, round, and reactive to light.  Neck:     Thyroid: No thyromegaly.     Vascular: No JVD.     Trachea: No tracheal deviation.  Cardiovascular:     Rate and Rhythm: Normal rate and regular rhythm.     Heart sounds: Normal heart sounds.  Pulmonary:  Effort: No respiratory distress.     Breath sounds: No stridor. No wheezing.  Abdominal:     General: Bowel sounds are normal. There is no distension.     Palpations: Abdomen is soft. There is no mass.     Tenderness: There is no abdominal tenderness. There is no guarding or rebound.  Musculoskeletal:        General: No tenderness.     Cervical back: Normal range of motion and neck supple. No rigidity.  Lymphadenopathy:     Cervical: No cervical adenopathy.  Skin:    Findings: No erythema or rash.  Neurological:     Cranial Nerves: No cranial nerve deficit.     Motor: No abnormal muscle tone.     Coordination: Coordination normal.     Deep Tendon Reflexes: Reflexes normal.  Psychiatric:        Behavior: Behavior normal.        Thought Content: Thought content normal.        Judgment: Judgment  normal.     Lab Results  Component Value Date   WBC 6.6 04/21/2022   HGB 14.3 04/21/2022   HCT 43.1 04/21/2022   PLT 305.0 04/21/2022   GLUCOSE 96 04/21/2022   CHOL 193 04/21/2022   TRIG 88.0 04/21/2022   HDL 72.00 04/21/2022   LDLCALC 103 (H) 04/21/2022   ALT 16 04/21/2022   AST 24 04/21/2022   NA 141 04/21/2022   K 4.3 04/21/2022   CL 104 04/21/2022   CREATININE 0.70 04/21/2022   BUN 13 04/21/2022   CO2 26 04/21/2022   TSH 1.64 04/21/2022    No results found.  Assessment & Plan:   Problem List Items Addressed This Visit     Dyslipidemia    Chronic  Lipitor A cardiac CT scan for calcium score is 89 in 9/20. Coronary arteries: Calcium noted in LM and proximal LAD      Relevant Medications   atorvastatin (LIPITOR) 20 MG tablet   Other Relevant Orders   TSH   Lipid panel   Well adult exam - Primary    We discussed age appropriate health related issues, including available/recomended screening tests and vaccinations. We discussed a need for adhering to healthy diet and exercise. Labs were ordered to be later reviewed . All questions were answered.  GYN - Dr Chestine Spore, Mammo q 12 mo  Colon due in 2020 Dr Marina Goodell Cologuard 2017, 2021 due 2024 A  cardiac CT scan for calcium scoring done. Shingrix, Prevnar 21, RSV -declined      Relevant Orders   TSH   Urinalysis   CBC with Differential/Platelet   Lipid panel   Comprehensive metabolic panel   Cologuard   HTN (hypertension)    On Rx      Relevant Medications   atorvastatin (LIPITOR) 20 MG tablet   losartan (COZAAR) 50 MG tablet   Other Relevant Orders   TSH   CBC with Differential/Platelet   Comprehensive metabolic panel   Other Visit Diagnoses     Screening for colon cancer       Relevant Orders   Cologuard         Meds ordered this encounter  Medications   atorvastatin (LIPITOR) 20 MG tablet    Sig: Take 1 tablet (20 mg total) by mouth daily.    Dispense:  90 tablet    Refill:  3   losartan  (COZAAR) 50 MG tablet    Sig: Take 1 tablet (50 mg total)  by mouth daily.    Dispense:  90 tablet    Refill:  3   famotidine (PEPCID) 40 MG tablet    Sig: Take 1 tablet (40 mg total) by mouth daily.    Dispense:  90 tablet    Refill:  3   DISCONTD: hydroxychloroquine (PLAQUENIL) 200 MG tablet    Sig: Take 400 mg bid x 2 days, then 200 mg daily for 5 days    Dispense:  13 tablet    Refill:  0   hydroxychloroquine (PLAQUENIL) 200 MG tablet    Sig: Take 400 mg bid x 2 days, then 200 mg daily for 5 days    Dispense:  16 tablet    Refill:  0      Follow-up: Return in about 1 year (around 04/27/2024) for Wellness Exam.  Sonda Primes, MD

## 2023-04-28 NOTE — Assessment & Plan Note (Signed)
Chronic  Lipitor A cardiac CT scan for calcium score is 89 in 9/20. Coronary arteries: Calcium noted in LM and proximal LAD

## 2023-05-25 ENCOUNTER — Other Ambulatory Visit: Payer: Self-pay | Admitting: Internal Medicine

## 2023-05-25 ENCOUNTER — Encounter: Payer: Self-pay | Admitting: Internal Medicine

## 2023-05-25 DIAGNOSIS — Z1231 Encounter for screening mammogram for malignant neoplasm of breast: Secondary | ICD-10-CM

## 2023-05-27 ENCOUNTER — Other Ambulatory Visit: Payer: Self-pay | Admitting: Internal Medicine

## 2023-05-27 DIAGNOSIS — Z1211 Encounter for screening for malignant neoplasm of colon: Secondary | ICD-10-CM

## 2023-06-18 ENCOUNTER — Other Ambulatory Visit: Payer: Self-pay | Admitting: Internal Medicine

## 2023-06-18 DIAGNOSIS — Z1211 Encounter for screening for malignant neoplasm of colon: Secondary | ICD-10-CM

## 2023-07-06 DIAGNOSIS — Z1211 Encounter for screening for malignant neoplasm of colon: Secondary | ICD-10-CM | POA: Diagnosis not present

## 2023-07-14 LAB — COLOGUARD: COLOGUARD: NEGATIVE

## 2023-08-10 ENCOUNTER — Ambulatory Visit
Admission: RE | Admit: 2023-08-10 | Discharge: 2023-08-10 | Disposition: A | Discharge status: Home / Self Care | Payer: Medicare PPO | Source: Ambulatory Visit | Attending: Internal Medicine | Admitting: Internal Medicine

## 2023-08-10 DIAGNOSIS — Z1231 Encounter for screening mammogram for malignant neoplasm of breast: Secondary | ICD-10-CM

## 2023-08-28 ENCOUNTER — Ambulatory Visit
Admission: RE | Admit: 2023-08-28 | Discharge: 2023-08-28 | Disposition: A | Discharge status: Home / Self Care | Payer: Medicare PPO | Source: Ambulatory Visit | Attending: Family Medicine

## 2023-08-28 VITALS — BP 147/75 | HR 99 | Temp 98.7°F | Resp 18

## 2023-08-28 DIAGNOSIS — H6993 Unspecified Eustachian tube disorder, bilateral: Secondary | ICD-10-CM

## 2023-08-28 DIAGNOSIS — J309 Allergic rhinitis, unspecified: Secondary | ICD-10-CM

## 2023-08-28 MED ORDER — PREDNISONE 20 MG PO TABS: ORAL_TABLET | ORAL | 0 refills | Status: DC

## 2023-08-28 NOTE — ED Triage Notes (Signed)
Pt reports bilateral ear fullness and aching x 3 days.

## 2023-08-28 NOTE — ED Provider Notes (Signed)
[name]  Note:  This document was prepared using Conservation officer, historic buildings and may include unintentional dictation errors.  MRN: [id number] DOB: April 12, 1951  Subjective:   [name] [name] is a 72 y.o. female presenting for 3-day history of acute onset bilateral ear fullness and mild to moderate aching.  No fever, drainage.  Has a history of significant allergies.  No history of ear infections.  No current facility-administered medications for this encounter.  Current Outpatient Medications:    albuterol (VENTOLIN HFA) 108 (90 Base) MCG/ACT inhaler, Inhale 1-2 puffs into the lungs every 6 (six) hours as needed for wheezing or shortness of breath., Disp: , Rfl:    atorvastatin (LIPITOR) 20 MG tablet, Take 1 tablet (20 mg total) by mouth daily., Disp: 90 tablet, Rfl: 3   b complex vitamins tablet, Take 1 tablet by mouth daily., Disp: 100 tablet, Rfl: 3   BREO ELLIPTA 200-25 MCG/INH AEPB, Inhale 1 puff into the lungs daily., Disp: , Rfl: 6   Cholecalciferol (VITAMIN D3) 50 MCG (2000 UT) capsule, Take 1 capsule (2,000 Units total) by mouth daily., Disp: 100 capsule, Rfl: 3   famotidine (PEPCID) 40 MG tablet, Take 1 tablet (40 mg total) by mouth daily., Disp: 90 tablet, Rfl: 3   fluticasone (FLONASE) 50 MCG/ACT nasal spray, Place 2 sprays into both nostrils daily., Disp: 16 g, Rfl: 6   hydroxychloroquine (PLAQUENIL) 200 MG tablet, Take 400 mg bid x 2 days, then 200 mg daily for 5 days, Disp: 16 tablet, Rfl: 0   loratadine (CLARITIN) 10 MG tablet, Take 10 mg by mouth daily.  , Disp: , Rfl:    losartan (COZAAR) 50 MG tablet, Take 1 tablet (50 mg total) by mouth daily., Disp: 90 tablet, Rfl: 3   Allergies  Allergen Reactions   Amoxicillin-Pot Clavulanate Nausea Only and Other (See Comments)   Alendronate Sodium     REACTION: stomach upset    Past Medical History:  Diagnosis Date   Allergic rhinitis    Anemia    NOS   Asthma    Carotid  stenosis    mild   GERD (gastroesophageal reflux disease)    Dr. Marina Goodell   Hyperlipidemia    Migraine    Osteopenia      Past Surgical History:  Procedure Laterality Date   TUBAL LIGATION      Family History  Problem Relation Age of Onset   Hypertension Mother    Cancer Father 28       Chest   Breast cancer Neg Hx     Social History   Tobacco Use   Smoking status: Never   Smokeless tobacco: Never  Substance Use Topics   Alcohol use: No   Drug use: No    ROS   Objective:   Vitals: BP (!) 147/75 (BP Location: Left Arm)   Pulse 99   Temp 98.7 F (37.1 C) (Oral)   Resp 18   SpO2 97%   Physical Exam Constitutional:      General: She is not in acute distress.    Appearance: Normal appearance. She is well-developed and normal weight. She is not ill-appearing, toxic-appearing or diaphoretic.  HENT:     Head: Normocephalic and atraumatic.     Right Ear: Ear canal and external ear normal. No drainage or tenderness. No middle ear effusion. There is no impacted cerumen. Tympanic membrane is not erythematous or bulging.     Left Ear: Ear canal and external ear  normal. No drainage or tenderness.  No middle ear effusion. There is no impacted cerumen. Tympanic membrane is not erythematous or bulging.     Ears:     Comments: Air-fluid level bilaterally.  No erythema, bulging of the TMs.    Nose: No congestion or rhinorrhea.     Mouth/Throat:     Mouth: Mucous membranes are moist. No oral lesions.     Pharynx: No pharyngeal swelling, oropharyngeal exudate, posterior oropharyngeal erythema or uvula swelling.     Tonsils: No tonsillar exudate or tonsillar abscesses.  Eyes:     General: No scleral icterus.       Right eye: No discharge.        Left eye: No discharge.     Extraocular Movements: Extraocular movements intact.     Right eye: Normal extraocular motion.     Left eye: Normal extraocular motion.     Conjunctiva/sclera: Conjunctivae normal.  Cardiovascular:      Rate and Rhythm: Normal rate.  Pulmonary:     Effort: Pulmonary effort is normal.  Musculoskeletal:     Cervical back: Normal range of motion and neck supple.  Lymphadenopathy:     Cervical: No cervical adenopathy.  Skin:    General: Skin is warm and dry.  Neurological:     General: No focal deficit present.     Mental Status: She is alert and oriented to person, place, and time.  Psychiatric:        Mood and Affect: Mood normal.        Behavior: Behavior normal.     Assessment and Plan :   PDMP not reviewed this encounter.  1. Eustachian tube dysfunction, bilateral   2. Allergic rhinitis, unspecified seasonality, unspecified trigger    Recommended oral prednisone course for eustachian tube dysfunction in the context of significant allergies.  Use Zyrtec as well.  Counseled patient on potential for adverse effects with medications prescribed/recommended today, ER and return-to-clinic precautions discussed, patient verbalized understanding.    Wallis Bamberg, PA-C 08/28/23 1710

## 2023-09-03 ENCOUNTER — Encounter (HOSPITAL_BASED_OUTPATIENT_CLINIC_OR_DEPARTMENT_OTHER): Payer: Self-pay

## 2023-09-03 ENCOUNTER — Ambulatory Visit: Payer: Medicare PPO

## 2023-09-03 ENCOUNTER — Ambulatory Visit (HOSPITAL_BASED_OUTPATIENT_CLINIC_OR_DEPARTMENT_OTHER)
Admission: RE | Admit: 2023-09-03 | Discharge: 2023-09-03 | Disposition: A | Discharge status: Home / Self Care | Payer: Medicare PPO | Source: Ambulatory Visit | Attending: Internal Medicine | Admitting: Internal Medicine

## 2023-09-03 VITALS — BP 126/73 | HR 87 | Temp 98.1°F | Resp 20 | Wt 155.0 lb

## 2023-09-03 DIAGNOSIS — H6993 Unspecified Eustachian tube disorder, bilateral: Secondary | ICD-10-CM | POA: Diagnosis not present

## 2023-09-03 DIAGNOSIS — J309 Allergic rhinitis, unspecified: Secondary | ICD-10-CM

## 2023-09-03 MED ORDER — FLUTICASONE PROPIONATE 50 MCG/ACT NA SUSP: 1.0000 | Freq: Every day | NASAL | 0 refills | Status: AC

## 2023-09-03 NOTE — ED Triage Notes (Signed)
States seen last week in [place] for eval of ear fullness.  States ears have drained and now feels as if secretions are settled in chest. Patient has hx of asthma. Reports cough now.Denies fevers.

## 2023-09-03 NOTE — ED Provider Notes (Signed)
[name]    CSN: [id number] Arrival date & time: 09/03/23  1434      History   Chief Complaint Chief Complaint  Patient presents with   Cough    Entered by patient   Ear Fullness    HPI [name] [name] is a 72 y.o. female.   Patient presents to care for evaluation of bilateral ear fullness and cough that started approximately 6 to 8 days ago.  She was seen in another urgent care at the beginning of symptom onset on August 28, 2023 (6 days ago) where she was felt to have eustachian tube dysfunction of both ears and cough related to allergic rhinitis.  She was prescribed a prednisone burst and took this as directed for 5 days.  States cough improved a little bit but she is still having ear fullness and frequent throat clearing.  She is concerned that the cough may have "settled into her chest" and wants to make sure that she does not have an asthma exacerbation, pneumonia, or bronchitis.  History of asthma, she has not needed to use her albuterol inhaler during this illness.  Denies chest pain, shortness of breath, N/V/D, abdominal pain, rash, and fever/chills.  No recent sick contacts with similar symptoms.  Takes Claritin daily for seasonal allergies.  Uses Flonase as needed.   Cough Ear Fullness    Past Medical History:  Diagnosis Date   Allergic rhinitis    Anemia    NOS   Asthma    Carotid stenosis    mild   GERD (gastroesophageal reflux disease)    Dr. Marina Goodell   Hyperlipidemia    Migraine    Osteopenia     Patient Active Problem List   Diagnosis Date Noted   BPV (benign positional vertigo) 04/21/2022   Tick bite 01/09/2021   Malaise 01/09/2021   Acute sinusitis 08/16/2020   Elevated coronary artery calcium score 06/30/2019   Coronary atherosclerosis due to lipid rich plaque 06/10/2019   Laryngitis 07/22/2018   Finger laceration 07/13/2018   Hypercholesterolemia 11/16/2017   Osteopenia 11/16/2017   Thoracic back pain 11/14/2014    Seromucinous otitis media 07/03/2011   HTN (hypertension) 07/03/2011   Well adult exam 02/27/2011   CONSTIPATION, CHRONIC 10/28/2010   NAUSEA 10/28/2010   ABDOMINAL PAIN, EPIGASTRIC 10/28/2010   BRONCHITIS, ACUTE 10/16/2009   UPPER RESPIRATORY INFECTION (URI) 07/21/2008   VERTIGO 07/21/2008   CONTACT DERMATITIS&OTHER ECZEMA DUE UNSPEC CAUSE 06/15/2008   OTHER SPECIFIED PRURITIC CONDITIONS 06/15/2008   INSOMNIA 05/08/2008   BURN OF UNSPECIFIED DEGREE OF PALM OF HAND 05/08/2008   CERVICALGIA 03/30/2008   HEMORRHOIDS 11/03/2007   ESOPHAGEAL STRICTURE 11/03/2007   OSTEOARTHRITIS 11/03/2007   HEADACHE 11/03/2007   ROTATOR CUFF SPRAIN AND STRAIN 11/03/2007   Unspecified otitis media 10/08/2007   ANEMIA-NOS 10/04/2007   ALLERGIC RHINITIS 10/04/2007   GERD 08/31/2007   Dyslipidemia 07/27/2007   CAROTID ARTERY STENOSIS 07/27/2007   Asthma 07/27/2007   OSTEOPENIA 07/27/2007    Past Surgical History:  Procedure Laterality Date   TUBAL LIGATION      OB History   No obstetric history on file.      Home Medications    Prior to Admission medications   Medication Sig Start Date End Date Taking? Authorizing Provider  fluticasone (FLONASE) 50 MCG/ACT nasal spray Place 1 spray into both nostrils daily. 09/03/23  Yes Carlisle Beers, FNP  albuterol (VENTOLIN HFA) 108 (90 Base) MCG/ACT inhaler Inhale 1-2 puffs into the lungs every 6 (  six) hours as needed for wheezing or shortness of breath.    [provider]  atorvastatin (LIPITOR) 20 MG tablet Take 1 tablet (20 mg total) by mouth daily. 04/28/23 04/27/24  Plotnikov, Georgina Quint, MD  b complex vitamins tablet Take 1 tablet by mouth daily. 04/12/19   Plotnikov, Georgina Quint, MD  BREO ELLIPTA 200-25 MCG/INH AEPB Inhale 1 puff into the lungs daily. 02/15/16   [provider]  Cholecalciferol (VITAMIN D3) 50 MCG (2000 UT) capsule Take 1 capsule (2,000 Units total) by mouth daily. 04/17/20   Plotnikov, Georgina Quint, MD  famotidine  (PEPCID) 40 MG tablet Take 1 tablet (40 mg total) by mouth daily. 04/28/23   Plotnikov, Georgina Quint, MD  hydroxychloroquine (PLAQUENIL) 200 MG tablet Take 400 mg bid x 2 days, then 200 mg daily for 5 days 04/28/23   Plotnikov, Georgina Quint, MD  loratadine (CLARITIN) 10 MG tablet Take 10 mg by mouth daily.      [provider]  losartan (COZAAR) 50 MG tablet Take 1 tablet (50 mg total) by mouth daily. 04/28/23   Plotnikov, Georgina Quint, MD  predniSONE (DELTASONE) 20 MG tablet Take 2 tablets daily with breakfast. 08/28/23   Wallis Bamberg, PA-C    Family History Family History  Problem Relation Age of Onset   Hypertension Mother    Cancer Father 43       Chest   Breast cancer Neg Hx     Social History Social History   Tobacco Use   Smoking status: Never   Smokeless tobacco: Never  Substance Use Topics   Alcohol use: No   Drug use: No     Allergies   Amoxicillin-pot clavulanate and Alendronate sodium   Review of Systems Review of Systems  Respiratory:  Positive for cough.   Per HPI   Physical Exam Triage Vital Signs ED Triage Vitals  Encounter Vitals Group     BP 09/03/23 1540 126/73     Systolic BP Percentile --      Diastolic BP Percentile --      Pulse Rate 09/03/23 1540 87     Resp 09/03/23 1540 20     Temp 09/03/23 1540 98.1 F (36.7 C)     Temp Source 09/03/23 1540 Oral     SpO2 09/03/23 1540 94 %     Weight 09/03/23 1541 155 lb (70.3 kg)     Height --      Head Circumference --      Peak Flow --      Pain Score 09/03/23 1541 0     Pain Loc --      Pain Education --      Exclude from Growth Chart --    No data found.  Updated Vital Signs BP 126/73 (BP Location: Right Arm)   Pulse 87   Temp 98.1 F (36.7 C) (Oral)   Resp 20   Wt 155 lb (70.3 kg)   SpO2 94%   BMI 25.02 kg/m   Visual Acuity Right Eye Distance:   Left Eye Distance:   Bilateral Distance:    Right Eye Near:   Left Eye Near:    Bilateral Near:     Physical Exam Vitals and  nursing note reviewed.  Constitutional:      Appearance: She is not ill-appearing or toxic-appearing.  HENT:     Head: Normocephalic and atraumatic.     Right Ear: Hearing, tympanic membrane, ear canal and external ear normal.  Left Ear: Hearing, tympanic membrane, ear canal and external ear normal.     Nose: Nose normal.     Mouth/Throat:     Lips: Pink.     Mouth: Mucous membranes are moist. No injury.     Tongue: No lesions. Tongue does not deviate from midline.     Palate: No mass and lesions.     Pharynx: Oropharynx is clear. Uvula midline. Postnasal drip (Moderate clear postnasal drainage to the posterior oropharynx without erythema.) present. No pharyngeal swelling, oropharyngeal exudate, posterior oropharyngeal erythema or uvula swelling.     Tonsils: No tonsillar exudate or tonsillar abscesses.  Eyes:     General: Lids are normal. Vision grossly intact. Gaze aligned appropriately.     Extraocular Movements: Extraocular movements intact.     Conjunctiva/sclera: Conjunctivae normal.  Cardiovascular:     Rate and Rhythm: Normal rate and regular rhythm.     Heart sounds: Normal heart sounds, S1 normal and S2 normal.  Pulmonary:     Effort: Pulmonary effort is normal. No respiratory distress.     Breath sounds: Normal breath sounds and air entry. No wheezing, rhonchi or rales.  Chest:     Chest wall: No tenderness.  Musculoskeletal:     Cervical back: Neck supple.  Skin:    General: Skin is warm and dry.     Capillary Refill: Capillary refill takes less than 2 seconds.     Findings: No rash.  Neurological:     General: No focal deficit present.     Mental Status: She is alert and oriented to person, place, and time. Mental status is at baseline.     Cranial Nerves: No dysarthria or facial asymmetry.  Psychiatric:        Mood and Affect: Mood normal.        Speech: Speech normal.        Behavior: Behavior normal.        Thought Content: Thought content normal.         Judgment: Judgment normal.      UC Treatments / Results  Labs (all labs ordered are listed, but only abnormal results are displayed) Labs Reviewed - No data to display  EKG   Radiology No results found.  Procedures Procedures (including critical care time)  Medications Ordered in UC Medications - No data to display  Initial Impression / Assessment and Plan / UC Course  I have reviewed the triage vital signs and the nursing notes.  Pertinent labs & imaging results that were available during my care of the patient were reviewed by me and considered in my medical decision making (see chart for details).   1.  Allergic rhinitis, dysfunction of both eustachian tubes Presentation consistent with bilateral eustachian tube dysfunction likely secondary to allergic rhinitis. Lungs are clear, no new oxygen requirement, and she is overall well-appearing, therefore deferred imaging of the chest. Low suspicion for bronchitis and pneumonia. Advised to take Flonase 1 puff into each nostril every day instead of as needed use. Continue Claritin daily. Follow-up with PCP as needed.  Counseled patient on potential for adverse effects with medications prescribed/recommended today, strict ER and return-to-clinic precautions discussed, patient verbalized understanding.    Final Clinical Impressions(s) / UC Diagnoses   Final diagnoses:  Allergic rhinitis, unspecified seasonality, unspecified trigger  Dysfunction of both eustachian tubes   Discharge Instructions   None    ED Prescriptions     Medication Sig Dispense Auth. Provider   fluticasone (FLONASE) 50  MCG/ACT nasal spray Place 1 spray into both nostrils daily. 16 g Carlisle Beers, FNP      PDMP not reviewed this encounter.   Carlisle Beers, [place] 09/03/23 [phone]

## 2023-12-01 ENCOUNTER — Ambulatory Visit
Admission: RE | Admit: 2023-12-01 | Discharge: 2023-12-01 | Disposition: A | Discharge status: Home / Self Care | Source: Ambulatory Visit | Attending: Family Medicine | Admitting: Family Medicine

## 2023-12-01 VITALS — BP 126/69 | HR 90 | Temp 98.4°F | Resp 17

## 2023-12-01 DIAGNOSIS — H6993 Unspecified Eustachian tube disorder, bilateral: Secondary | ICD-10-CM | POA: Diagnosis not present

## 2023-12-01 MED ORDER — PREDNISONE 20 MG PO TABS: 40.0000 mg | ORAL_TABLET | Freq: Every day | ORAL | 0 refills | Status: AC

## 2023-12-01 NOTE — ED Provider Notes (Signed)
 [name]    CSN: [id number] Arrival date & time: 12/01/23  1005      History   Chief Complaint Chief Complaint  Patient presents with   Ear Fullness    Entered by patient    HPI [name] [name] is a 73 y.o. female.    Ear Fullness  Patient is here for bilateral ear pain/fullness for about a week.  No runny nose, congestion.  Slight pressure above her eyes.  No fevers/chills.  Temp of 99.3.   No otc meds taken.  She was seen for this same thing several times in December 2024.  She was given steroids at that time.        Past Medical History:  Diagnosis Date   Allergic rhinitis    Anemia    NOS   Asthma    Carotid stenosis    mild   GERD (gastroesophageal reflux disease)    Dr. Marina Goodell   Hyperlipidemia    Migraine    Osteopenia     Patient Active Problem List   Diagnosis Date Noted   BPV (benign positional vertigo) 04/21/2022   Tick bite 01/09/2021   Malaise 01/09/2021   Acute sinusitis 08/16/2020   Elevated coronary artery calcium score 06/30/2019   Coronary atherosclerosis due to lipid rich plaque 06/10/2019   Laryngitis 07/22/2018   Finger laceration 07/13/2018   Hypercholesterolemia 11/16/2017   Osteopenia 11/16/2017   Thoracic back pain 11/14/2014   Seromucinous otitis media 07/03/2011   HTN (hypertension) 07/03/2011   Well adult exam 02/27/2011   CONSTIPATION, CHRONIC 10/28/2010   NAUSEA 10/28/2010   ABDOMINAL PAIN, EPIGASTRIC 10/28/2010   BRONCHITIS, ACUTE 10/16/2009   UPPER RESPIRATORY INFECTION (URI) 07/21/2008   VERTIGO 07/21/2008   CONTACT DERMATITIS&OTHER ECZEMA DUE UNSPEC CAUSE 06/15/2008   OTHER SPECIFIED PRURITIC CONDITIONS 06/15/2008   INSOMNIA 05/08/2008   BURN OF UNSPECIFIED DEGREE OF PALM OF HAND 05/08/2008   CERVICALGIA 03/30/2008   HEMORRHOIDS 11/03/2007   ESOPHAGEAL STRICTURE 11/03/2007   OSTEOARTHRITIS 11/03/2007   HEADACHE 11/03/2007   ROTATOR CUFF SPRAIN AND STRAIN 11/03/2007   Unspecified  otitis media 10/08/2007   ANEMIA-NOS 10/04/2007   ALLERGIC RHINITIS 10/04/2007   GERD 08/31/2007   Dyslipidemia 07/27/2007   CAROTID ARTERY STENOSIS 07/27/2007   Asthma 07/27/2007   OSTEOPENIA 07/27/2007    Past Surgical History:  Procedure Laterality Date   TUBAL LIGATION      OB History   No obstetric history on file.      Home Medications    Prior to Admission medications   Medication Sig Start Date End Date Taking? Authorizing Provider  albuterol (VENTOLIN HFA) 108 (90 Base) MCG/ACT inhaler Inhale 1-2 puffs into the lungs every 6 (six) hours as needed for wheezing or shortness of breath.    [provider]  atorvastatin (LIPITOR) 20 MG tablet Take 1 tablet (20 mg total) by mouth daily. 04/28/23 04/27/24  Plotnikov, Georgina Quint, MD  b complex vitamins tablet Take 1 tablet by mouth daily. 04/12/19   Plotnikov, Georgina Quint, MD  BREO ELLIPTA 200-25 MCG/INH AEPB Inhale 1 puff into the lungs daily. 02/15/16   [provider]  Cholecalciferol (VITAMIN D3) 50 MCG (2000 UT) capsule Take 1 capsule (2,000 Units total) by mouth daily. 04/17/20   Plotnikov, Georgina Quint, MD  famotidine (PEPCID) 40 MG tablet Take 1 tablet (40 mg total) by mouth daily. 04/28/23   Plotnikov, Georgina Quint, MD  fluticasone (FLONASE) 50 MCG/ACT nasal spray Place 1 spray into both nostrils  daily. 09/03/23   Carlisle Beers, FNP  hydroxychloroquine (PLAQUENIL) 200 MG tablet Take 400 mg bid x 2 days, then 200 mg daily for 5 days 04/28/23   Plotnikov, Georgina Quint, MD  loratadine (CLARITIN) 10 MG tablet Take 10 mg by mouth daily.      [provider]  losartan (COZAAR) 50 MG tablet Take 1 tablet (50 mg total) by mouth daily. 04/28/23   Plotnikov, Georgina Quint, MD  predniSONE (DELTASONE) 20 MG tablet Take 2 tablets daily with breakfast. 08/28/23   Wallis Bamberg, PA-C    Family History Family History  Problem Relation Age of Onset   Hypertension Mother    Cancer Father 43       Chest   Breast cancer Neg  Hx     Social History Social History   Tobacco Use   Smoking status: Never   Smokeless tobacco: Never  Substance Use Topics   Alcohol use: No   Drug use: No     Allergies   Amoxicillin-pot clavulanate and Alendronate sodium   Review of Systems Review of Systems  Constitutional: Negative.   HENT:  Positive for ear pain.   Respiratory: Negative.    Cardiovascular: Negative.   Gastrointestinal: Negative.   Musculoskeletal: Negative.   Psychiatric/Behavioral: Negative.       Physical Exam Triage Vital Signs ED Triage Vitals [12/01/23 1014]  Encounter Vitals Group     BP 126/69     Systolic BP Percentile      Diastolic BP Percentile      Pulse Rate 90     Resp 17     Temp 98.4 F (36.9 C)     Temp Source Oral     SpO2 94 %     Weight      Height      Head Circumference      Peak Flow      Pain Score 6     Pain Loc      Pain Education      Exclude from Growth Chart    No data found.  Updated Vital Signs BP 126/69 (BP Location: Right Arm)   Pulse 90   Temp 98.4 F (36.9 C) (Oral)   Resp 17   SpO2 94%   Visual Acuity Right Eye Distance:   Left Eye Distance:   Bilateral Distance:    Right Eye Near:   Left Eye Near:    Bilateral Near:     Physical Exam Constitutional:      Appearance: Normal appearance.  HENT:     Right Ear: A middle ear effusion is present. Tympanic membrane is not erythematous or bulging.     Left Ear: A middle ear effusion is present. Tympanic membrane is not erythematous or bulging.  Cardiovascular:     Rate and Rhythm: Normal rate and regular rhythm.  Pulmonary:     Effort: Pulmonary effort is normal.     Breath sounds: Normal breath sounds.  Musculoskeletal:     Cervical back: Normal range of motion and neck supple. No tenderness.  Neurological:     General: No focal deficit present.     Mental Status: She is alert.  Psychiatric:        Mood and Affect: Mood normal.      UC Treatments / Results  Labs (all  labs ordered are listed, but only abnormal results are displayed) Labs Reviewed - No data to display  EKG   Radiology No results found.  Procedures Procedures (including critical care time)  Medications Ordered in UC Medications - No data to display  Initial Impression / Assessment and Plan / UC Course  I have reviewed the triage vital signs and the nursing notes.  Pertinent labs & imaging results that were available during my care of the patient were reviewed by me and considered in my medical decision making (see chart for details).   Final Clinical Impressions(s) / UC Diagnoses   Final diagnoses:  Eustachian tube dysfunction, bilateral     Discharge Instructions      You were seen today for eustachian tube dysfunction.  I have sent out daily prednisone to use for the next 5 days.  Please continue your claritin.  When done with the oral prednisone please start the flonase daily.  If this continues, please return.  You may need to see an Ear Nose and Throat specialist.     ED Prescriptions     Medication Sig Dispense Auth. Provider   predniSONE (DELTASONE) 20 MG tablet Take 2 tablets (40 mg total) by mouth daily for 5 days. 10 tablet Jannifer Franklin, MD      PDMP not reviewed this encounter.   Jannifer Franklin, MD 12/01/23 1028

## 2023-12-01 NOTE — Discharge Instructions (Signed)
 You were seen today for eustachian tube dysfunction.  I have sent out daily prednisone to use for the next 5 days.  Please continue your claritin.  When done with the oral prednisone please start the flonase daily.  If this continues, please return.  You may need to see an Ear Nose and Throat specialist.

## 2023-12-01 NOTE — ED Triage Notes (Signed)
 Pt c/o bilateral ear pain for over 1 week.

## 2023-12-14 ENCOUNTER — Telehealth: Payer: Self-pay | Admitting: Internal Medicine

## 2023-12-14 MED ORDER — AZITHROMYCIN 250 MG PO TABS: ORAL_TABLET | ORAL | 0 refills | Status: DC

## 2023-12-14 NOTE — Telephone Encounter (Signed)
 Sinusitis x 2 weeks Zpac

## 2023-12-28 ENCOUNTER — Encounter: Payer: Self-pay | Admitting: Internal Medicine

## 2023-12-28 ENCOUNTER — Ambulatory Visit: Admitting: Internal Medicine

## 2023-12-28 VITALS — BP 118/82 | HR 82 | Temp 98.0°F | Ht 66.0 in | Wt 156.8 lb

## 2023-12-28 DIAGNOSIS — J4520 Mild intermittent asthma, uncomplicated: Secondary | ICD-10-CM | POA: Diagnosis not present

## 2023-12-28 DIAGNOSIS — M25552 Pain in left hip: Secondary | ICD-10-CM | POA: Diagnosis not present

## 2023-12-28 DIAGNOSIS — H0510 Unspecified chronic inflammatory disorders of orbit: Secondary | ICD-10-CM

## 2023-12-28 DIAGNOSIS — I10 Essential (primary) hypertension: Secondary | ICD-10-CM

## 2023-12-28 DIAGNOSIS — M25559 Pain in unspecified hip: Secondary | ICD-10-CM | POA: Insufficient documentation

## 2023-12-28 DIAGNOSIS — J069 Acute upper respiratory infection, unspecified: Secondary | ICD-10-CM | POA: Diagnosis not present

## 2023-12-28 NOTE — Assessment & Plan Note (Signed)
 Better

## 2023-12-28 NOTE — Assessment & Plan Note (Signed)
 Doing well

## 2023-12-28 NOTE — Assessment & Plan Note (Signed)
On Rx 

## 2023-12-28 NOTE — Patient Instructions (Addendum)
 Left trochanteric bursitis Hip opener exercises

## 2023-12-28 NOTE — Progress Notes (Signed)
 Subjective:  Patient ID: [name] [name], female    DOB: 09/17/1950  Age: 73 y.o. MRN: [id number]  CC: Ear Fullness (Ear fullness. Patient has since been seen by urgent care twice where this was treated with prednisone , wants to make sure all of this is cleared up) and Fall (Fall within the last 2 months. Patient noted falling on left hip within that time and has been having intermittent pain (mostly when climbing stairs and applying pressure). Has been treating with heat which seems to help)   HPI [name] [name] presents for L lateral hip pain x 2 months-it is getting worse. C/o ear fullness  URI - 90% better   Outpatient Medications Prior to Visit  Medication Sig Dispense Refill   albuterol (VENTOLIN HFA) 108 (90 Base) MCG/ACT inhaler Inhale 1-2 puffs into the lungs every 6 (six) hours as needed for wheezing or shortness of breath.     atorvastatin  (LIPITOR) 20 MG tablet Take 1 tablet (20 mg total) by mouth daily. 90 tablet 3   azithromycin  (ZITHROMAX  Z-PAK) 250 MG tablet As directed 6 tablet 0   b complex vitamins tablet Take 1 tablet by mouth daily. 100 tablet 3   BREO ELLIPTA 200-25 MCG/INH AEPB Inhale 1 puff into the lungs daily.  6   Cholecalciferol (VITAMIN D3) 50 MCG (2000 UT) capsule Take 1 capsule (2,000 Units total) by mouth daily. 100 capsule 3   famotidine  (PEPCID ) 40 MG tablet Take 1 tablet (40 mg total) by mouth daily. 90 tablet 3   fluticasone  (FLONASE ) 50 MCG/ACT nasal spray Place 1 spray into both nostrils daily. 16 g 0   hydroxychloroquine  (PLAQUENIL ) 200 MG tablet Take 400 mg bid x 2 days, then 200 mg daily for 5 days 16 tablet 0   loratadine (CLARITIN) 10 MG tablet Take 10 mg by mouth daily.       losartan  (COZAAR ) 50 MG tablet Take 1 tablet (50 mg total) by mouth daily. 90 tablet 3   No facility-administered medications prior to visit.    ROS: Review of Systems  Constitutional:  Negative for activity change, appetite change, chills, fatigue  and unexpected weight change.  HENT:  Negative for congestion, mouth sores and sinus pressure.   Eyes:  Negative for visual disturbance.  Respiratory:  Negative for cough and chest tightness.   Gastrointestinal:  Negative for abdominal pain and nausea.  Genitourinary:  Negative for difficulty urinating, frequency and vaginal pain.  Musculoskeletal:  Positive for gait problem. Negative for back pain.  Skin:  Negative for pallor and rash.  Neurological:  Negative for dizziness, tremors, weakness, numbness and headaches.  Psychiatric/Behavioral:  Negative for confusion and sleep disturbance.     Objective:  BP 118/82   Pulse 82   Temp 98 F (36.7 C)   Ht 5\' 6"  (1.676 m)   Wt 156 lb 12.8 oz (71.1 kg)   SpO2 98%   BMI 25.31 kg/m   BP Readings from Last 3 Encounters:  12/28/23 118/82  12/01/23 126/69  09/03/23 126/73    Wt Readings from Last 3 Encounters:  12/28/23 156 lb 12.8 oz (71.1 kg)  09/03/23 155 lb (70.3 kg)  04/28/23 158 lb (71.7 kg)    Physical Exam Constitutional:      General: She is not in acute distress.    Appearance: She is well-developed.  HENT:     Head: Normocephalic.     Right Ear: External ear normal.     Left Ear: External ear normal.  Nose: Nose normal.  Eyes:     General:        Right eye: No discharge.        Left eye: No discharge.     Conjunctiva/sclera: Conjunctivae normal.     Pupils: Pupils are equal, round, and reactive to light.  Neck:     Thyroid : No thyromegaly.     Vascular: No JVD.     Trachea: No tracheal deviation.  Cardiovascular:     Rate and Rhythm: Normal rate and regular rhythm.     Heart sounds: Normal heart sounds.  Pulmonary:     Effort: No respiratory distress.     Breath sounds: No stridor. No wheezing.  Abdominal:     General: Bowel sounds are normal. There is no distension.     Palpations: Abdomen is soft. There is no mass.     Tenderness: There is no abdominal tenderness. There is no guarding or rebound.   Musculoskeletal:        General: Tenderness present.     Cervical back: Normal range of motion and neck supple. No rigidity.     Right lower leg: No edema.     Left lower leg: No edema.  Lymphadenopathy:     Cervical: No cervical adenopathy.  Skin:    Findings: No erythema or rash.  Neurological:     Cranial Nerves: No cranial nerve deficit.     Motor: No abnormal muscle tone.     Coordination: Coordination normal.     Deep Tendon Reflexes: Reflexes normal.  Psychiatric:        Behavior: Behavior normal.        Thought Content: Thought content normal.        Judgment: Judgment normal.   Left lateral hip is painful with palpation, full range of motion in the hip joint Straight leg elevation is negative bilaterally  Lab Results  Component Value Date   WBC 5.7 04/28/2023   HGB 13.7 04/28/2023   HCT 41.0 04/28/2023   PLT 306.0 04/28/2023   GLUCOSE 101 (H) 04/28/2023   CHOL 185 04/28/2023   TRIG 58.0 04/28/2023   HDL 68.40 04/28/2023   LDLCALC 105 (H) 04/28/2023   ALT 17 04/28/2023   AST 19 04/28/2023   NA 142 04/28/2023   K 4.0 04/28/2023   CL 107 04/28/2023   CREATININE 0.70 04/28/2023   BUN 14 04/28/2023   CO2 27 04/28/2023   TSH 1.21 04/28/2023    No results found.  Assessment & Plan:   Problem List Items Addressed This Visit     URI, acute   Better       Asthma   Doing well.      HTN (hypertension)   On Rx      Hip pain - Primary   Left trochanteric bursitis      Other Visit Diagnoses       Trochleitis             No orders of the defined types were placed in this encounter.     Follow-up: Return in about 3 months (around 03/28/2024) for a follow-up visit.  Anitra Barn, MD

## 2023-12-28 NOTE — Assessment & Plan Note (Signed)
Left trochanteric bursitis.

## 2023-12-31 DIAGNOSIS — Z01419 Encounter for gynecological examination (general) (routine) without abnormal findings: Secondary | ICD-10-CM | POA: Diagnosis not present

## 2024-01-13 ENCOUNTER — Ambulatory Visit: Admitting: Internal Medicine

## 2024-01-13 DIAGNOSIS — H2513 Age-related nuclear cataract, bilateral: Secondary | ICD-10-CM | POA: Diagnosis not present

## 2024-01-13 DIAGNOSIS — H5203 Hypermetropia, bilateral: Secondary | ICD-10-CM | POA: Diagnosis not present

## 2024-02-10 DIAGNOSIS — J4530 Mild persistent asthma, uncomplicated: Secondary | ICD-10-CM | POA: Diagnosis not present

## 2024-02-10 DIAGNOSIS — J300 Vasomotor rhinitis: Secondary | ICD-10-CM | POA: Diagnosis not present

## 2024-04-12 DIAGNOSIS — L812 Freckles: Secondary | ICD-10-CM | POA: Diagnosis not present

## 2024-04-12 DIAGNOSIS — L821 Other seborrheic keratosis: Secondary | ICD-10-CM | POA: Diagnosis not present

## 2024-04-12 DIAGNOSIS — L718 Other rosacea: Secondary | ICD-10-CM | POA: Diagnosis not present

## 2024-04-12 DIAGNOSIS — Z85828 Personal history of other malignant neoplasm of skin: Secondary | ICD-10-CM | POA: Diagnosis not present

## 2024-04-12 DIAGNOSIS — D2262 Melanocytic nevi of left upper limb, including shoulder: Secondary | ICD-10-CM | POA: Diagnosis not present

## 2024-04-21 ENCOUNTER — Other Ambulatory Visit: Payer: Self-pay | Admitting: Internal Medicine

## 2024-05-02 ENCOUNTER — Encounter: Payer: Self-pay | Admitting: Internal Medicine

## 2024-05-02 ENCOUNTER — Ambulatory Visit: Payer: Medicare PPO | Admitting: Internal Medicine

## 2024-05-02 VITALS — BP 138/81 | HR 77 | Temp 97.7°F | Ht 66.0 in | Wt 155.0 lb

## 2024-05-02 DIAGNOSIS — J4530 Mild persistent asthma, uncomplicated: Secondary | ICD-10-CM | POA: Insufficient documentation

## 2024-05-02 DIAGNOSIS — Z0000 Encounter for general adult medical examination without abnormal findings: Secondary | ICD-10-CM

## 2024-05-02 DIAGNOSIS — I10 Essential (primary) hypertension: Secondary | ICD-10-CM | POA: Diagnosis not present

## 2024-05-02 DIAGNOSIS — I2583 Coronary atherosclerosis due to lipid rich plaque: Secondary | ICD-10-CM

## 2024-05-02 DIAGNOSIS — J300 Vasomotor rhinitis: Secondary | ICD-10-CM | POA: Insufficient documentation

## 2024-05-02 DIAGNOSIS — E785 Hyperlipidemia, unspecified: Secondary | ICD-10-CM | POA: Diagnosis not present

## 2024-05-02 MED ORDER — FAMOTIDINE 20 MG PO TABS: 20.0000 mg | ORAL_TABLET | Freq: Two times a day (BID) | ORAL | 3 refills | Status: DC

## 2024-05-02 MED ORDER — LOVASTATIN 20 MG PO TABS
20.0000 mg | ORAL_TABLET | Freq: Every day | ORAL | 3 refills | Status: DC
Start: 2024-05-02 — End: 2024-06-13

## 2024-05-02 NOTE — Assessment & Plan Note (Signed)
 Chronic  On Lipitor A cardiac CT scan for calcium  score is 89 in 9/20. Coronary arteries: Calcium  noted in LM and proximal LAD Carotid US  - minimal plaque 2020

## 2024-05-02 NOTE — Progress Notes (Addendum)
 Subjective:  Patient ID: [name] [name], female    DOB: Jan 06, 1951  Age: 73 y.o. MRN: [id number]  CC: Annual Exam (Discuss medication and Carotid artery testing)   HPI [name] [name] presents for a well exam  Outpatient Medications Prior to Visit  Medication Sig Dispense Refill   albuterol (VENTOLIN HFA) 108 (90 Base) MCG/ACT inhaler Inhale 1-2 puffs into the lungs every 6 (six) hours as needed for wheezing or shortness of breath.     azithromycin  (ZITHROMAX  Z-PAK) 250 MG tablet As directed 6 tablet 0   b complex vitamins tablet Take 1 tablet by mouth daily. 100 tablet 3   BREO ELLIPTA 200-25 MCG/INH AEPB Inhale 1 puff into the lungs daily.  6   Cholecalciferol (VITAMIN D3) 50 MCG (2000 UT) capsule Take 1 capsule (2,000 Units total) by mouth daily. 100 capsule 3   fluticasone  (FLONASE ) 50 MCG/ACT nasal spray Place 1 spray into both nostrils daily. 16 g 0   hydroxychloroquine  (PLAQUENIL ) 200 MG tablet Take 400 mg bid x 2 days, then 200 mg daily for 5 days 16 tablet 0   loratadine (CLARITIN) 10 MG tablet Take 10 mg by mouth daily.       losartan  (COZAAR ) 50 MG tablet TAKE 1 TABLET (50 MG TOTAL) BY MOUTH DAILY. 90 tablet 3   Spacer/Aero-Holding Chambers (AEROCHAMBER MV) inhaler as directed inhalation prn; Duration: 30 days     atorvastatin  (LIPITOR) 20 MG tablet Take 1 tablet (20 mg total) by mouth daily. 90 tablet 3   famotidine  (PEPCID ) 40 MG tablet Take 1 tablet (40 mg total) by mouth daily. 90 tablet 3   No facility-administered medications prior to visit.    ROS: Review of Systems  Constitutional:  Negative for activity change, appetite change, chills, fatigue and unexpected weight change.  HENT:  Negative for congestion, mouth sores and sinus pressure.   Eyes:  Negative for visual disturbance.  Respiratory:  Negative for cough and chest tightness.   Gastrointestinal:  Negative for abdominal pain and nausea.  Genitourinary:  Negative for difficulty  urinating, frequency and vaginal pain.  Musculoskeletal:  Positive for arthralgias. Negative for back pain and gait problem.  Skin:  Negative for pallor and rash.  Neurological:  Negative for dizziness, tremors, weakness, numbness and headaches.  Psychiatric/Behavioral:  Negative for confusion and sleep disturbance.     Objective:  BP 138/81   Pulse 77   Temp 97.7 F (36.5 C) (Oral)   Ht 5' 6 (1.676 m)   Wt 155 lb (70.3 kg)   SpO2 100%   BMI 25.02 kg/m   BP Readings from Last 3 Encounters:  05/02/24 138/81  12/28/23 118/82  12/01/23 126/69    Wt Readings from Last 3 Encounters:  05/02/24 155 lb (70.3 kg)  12/28/23 156 lb 12.8 oz (71.1 kg)  09/03/23 155 lb (70.3 kg)    Physical Exam Constitutional:      General: She is not in acute distress.    Appearance: She is well-developed.  HENT:     Head: Normocephalic.     Right Ear: External ear normal.     Left Ear: External ear normal.     Nose: Nose normal.  Eyes:     General:        Right eye: No discharge.        Left eye: No discharge.     Conjunctiva/sclera: Conjunctivae normal.     Pupils: Pupils are equal, round, and reactive to light.  Neck:  Thyroid : No thyromegaly.     Vascular: No JVD.     Trachea: No tracheal deviation.  Cardiovascular:     Rate and Rhythm: Normal rate and regular rhythm.     Heart sounds: Normal heart sounds.  Pulmonary:     Effort: No respiratory distress.     Breath sounds: No stridor. No wheezing.  Abdominal:     General: Bowel sounds are normal. There is no distension.     Palpations: Abdomen is soft. There is no mass.     Tenderness: There is no abdominal tenderness. There is no guarding or rebound.  Musculoskeletal:        General: No tenderness.     Cervical back: Normal range of motion and neck supple. No rigidity.  Lymphadenopathy:     Cervical: No cervical adenopathy.  Skin:    Findings: No erythema or rash.  Neurological:     Cranial Nerves: No cranial nerve  deficit.     Motor: No abnormal muscle tone.     Coordination: Coordination normal.     Deep Tendon Reflexes: Reflexes normal.  Psychiatric:        Behavior: Behavior normal.        Thought Content: Thought content normal.        Judgment: Judgment normal.     Lab Results  Component Value Date   WBC 5.7 04/28/2023   HGB 13.7 04/28/2023   HCT 41.0 04/28/2023   PLT 306.0 04/28/2023   GLUCOSE 101 (H) 04/28/2023   CHOL 185 04/28/2023   TRIG 58.0 04/28/2023   HDL 68.40 04/28/2023   LDLCALC 105 (H) 04/28/2023   ALT 17 04/28/2023   AST 19 04/28/2023   NA 142 04/28/2023   K 4.0 04/28/2023   CL 107 04/28/2023   CREATININE 0.70 04/28/2023   BUN 14 04/28/2023   CO2 27 04/28/2023   TSH 1.21 04/28/2023    No results found.  Assessment & Plan:   Problem List Items Addressed This Visit     Coronary atherosclerosis due to lipid rich plaque   Chronic  On Lipitor A cardiac CT scan for calcium  score is 89 in 9/20. Coronary arteries: Calcium  noted in LM and proximal LAD Carotid US  - minimal plaque 2020      Relevant Medications   lovastatin  (MEVACOR ) 20 MG tablet   Other Relevant Orders   CBC with Differential/Platelet   Dyslipidemia   Chronic  On Lipitor A cardiac CT scan for calcium  score is 89 in 9/20. Coronary arteries: Calcium  noted in LM and proximal LAD Carotid US  - minimal plaque 2020      Relevant Medications   lovastatin  (MEVACOR ) 20 MG tablet   Other Relevant Orders   TSH   Lipid panel   HTN (hypertension)   On Losartan       Relevant Medications   lovastatin  (MEVACOR ) 20 MG tablet   Other Relevant Orders   CBC with Differential/Platelet   Well adult exam - Primary    We discussed age appropriate health related issues, including available/recomended screening tests and vaccinations. We discussed a need for adhering to healthy diet and exercise. Labs were ordered to be later reviewed . All questions were answered.  GYN - Dr Gretta, Mammo q 12 mo  Colon  due in 2020 Dr Abran Cologuard 2017, 2021 due 2024 A  cardiac CT scan for calcium  scoring done. Shingrix, Prevnar 21, RSV -declined      Relevant Orders   TSH   Urinalysis  CBC with Differential/Platelet   Lipid panel   Comprehensive metabolic panel with GFR      Meds ordered this encounter  Medications   famotidine  (PEPCID ) 20 MG tablet    Sig: Take 1 tablet (20 mg total) by mouth 2 (two) times daily.    Dispense:  90 tablet    Refill:  3   lovastatin  (MEVACOR ) 20 MG tablet    Sig: Take 1 tablet (20 mg total) by mouth at bedtime.    Dispense:  90 tablet    Refill:  3      Follow-up: Return in about 1 year (around 05/02/2025) for Wellness Exam.  Marolyn Noel, MD

## 2024-05-02 NOTE — Assessment & Plan Note (Signed)
We discussed age appropriate health related issues, including available/recomended screening tests and vaccinations. We discussed a need for adhering to healthy diet and exercise. Labs were ordered to be later reviewed . All questions were answered.  GYN - Dr Chestine Spore, Mammo q 12 mo  Colon due in 2020 Dr Marina Goodell Cologuard 2017, 2021 due 2024 A  cardiac CT scan for calcium scoring done. Shingrix, Prevnar 21, RSV -declined

## 2024-05-02 NOTE — Assessment & Plan Note (Addendum)
On Losartan 

## 2024-06-02 ENCOUNTER — Other Ambulatory Visit (INDEPENDENT_AMBULATORY_CARE_PROVIDER_SITE_OTHER)

## 2024-06-02 ENCOUNTER — Telehealth: Payer: Self-pay | Admitting: Internal Medicine

## 2024-06-02 DIAGNOSIS — Z0000 Encounter for general adult medical examination without abnormal findings: Secondary | ICD-10-CM

## 2024-06-02 DIAGNOSIS — E785 Hyperlipidemia, unspecified: Secondary | ICD-10-CM

## 2024-06-02 DIAGNOSIS — I10 Essential (primary) hypertension: Secondary | ICD-10-CM

## 2024-06-02 DIAGNOSIS — I2583 Coronary atherosclerosis due to lipid rich plaque: Secondary | ICD-10-CM | POA: Diagnosis not present

## 2024-06-02 LAB — CBC WITH DIFFERENTIAL/PLATELET
Basophils Absolute: 0 K/uL (ref 0.0–0.1)
Basophils Relative: 0.8 % (ref 0.0–3.0)
Eosinophils Absolute: 0.1 K/uL (ref 0.0–0.7)
Eosinophils Relative: 1.8 % (ref 0.0–5.0)
HCT: 40.5 % (ref 36.0–46.0)
Hemoglobin: 13.5 g/dL (ref 12.0–15.0)
Lymphocytes Relative: 35.7 % (ref 12.0–46.0)
Lymphs Abs: 2.1 K/uL (ref 0.7–4.0)
MCHC: 33.2 g/dL (ref 30.0–36.0)
MCV: 86.8 fl (ref 78.0–100.0)
Monocytes Absolute: 0.5 K/uL (ref 0.1–1.0)
Monocytes Relative: 8.4 % (ref 3.0–12.0)
Neutro Abs: 3.1 K/uL (ref 1.4–7.7)
Neutrophils Relative %: 53.3 % (ref 43.0–77.0)
Platelets: 314 K/uL (ref 150.0–400.0)
RBC: 4.67 Mil/uL (ref 3.87–5.11)
RDW: 13.8 % (ref 11.5–15.5)
WBC: 5.8 K/uL (ref 4.0–10.5)

## 2024-06-02 LAB — COMPREHENSIVE METABOLIC PANEL WITH GFR
ALT: 18 U/L (ref 0–35)
AST: 19 U/L (ref 0–37)
Albumin: 4.3 g/dL (ref 3.5–5.2)
Alkaline Phosphatase: 42 U/L (ref 39–117)
BUN: 15 mg/dL (ref 6–23)
CO2: 27 meq/L (ref 19–32)
Calcium: 9.5 mg/dL (ref 8.4–10.5)
Chloride: 105 meq/L (ref 96–112)
Creatinine, Ser: 0.7 mg/dL (ref 0.40–1.20)
GFR: 85.33 mL/min (ref >60.00)
Glucose, Bld: 96 mg/dL (ref 70–99)
Potassium: 4 meq/L (ref 3.5–5.1)
Sodium: 141 meq/L (ref 135–145)
Total Bilirubin: 0.7 mg/dL (ref 0.2–1.2)
Total Protein: 7.2 g/dL (ref 6.0–8.3)

## 2024-06-02 LAB — URINALYSIS
Bilirubin Urine: NEGATIVE
Ketones, ur: NEGATIVE
Leukocytes,Ua: NEGATIVE
Nitrite: NEGATIVE
Specific Gravity, Urine: 1.025 (ref 1.000–1.030)
Total Protein, Urine: NEGATIVE
Urine Glucose: NEGATIVE
Urobilinogen, UA: 0.2 (ref 0.0–1.0)
pH: 6 (ref 5.0–8.0)

## 2024-06-02 LAB — LIPID PANEL
Cholesterol: 161 mg/dL (ref 0–200)
HDL: 70.9 mg/dL (ref >39.00)
LDL Cholesterol: 76 mg/dL (ref 0–99)
NonHDL: 89.79
Total CHOL/HDL Ratio: 2
Triglycerides: 68 mg/dL (ref 0.0–149.0)
VLDL: 13.6 mg/dL (ref 0.0–40.0)

## 2024-06-02 LAB — TSH: TSH: 1.73 u[IU]/mL (ref 0.35–5.50)

## 2024-06-02 NOTE — Telephone Encounter (Signed)
 Pt dropped off letter.Letter is placed in providers mailbox. Please advise, Thanks

## 2024-06-03 ENCOUNTER — Ambulatory Visit: Payer: Self-pay | Admitting: Internal Medicine

## 2024-06-03 NOTE — Telephone Encounter (Signed)
/  I/ have patients letter. And I have left it on Dr. Alexandra desk. 06/03/2024  @ 2:36 p.m

## 2024-06-13 MED ORDER — FAMOTIDINE 40 MG PO TABS: 40.0000 mg | ORAL_TABLET | Freq: Every day | ORAL | 3 refills | Status: AC

## 2024-06-13 NOTE — Telephone Encounter (Signed)
 Please call [name] [name] dropped off a letter for me.  Lipitor gave her headaches.  The lovastatin  gave her chest pain.  She stopped lovastatin  and went back on Lipitor  She can take Lipitor 20 mg tablet one half every other day for 2-3 times a week.  If problems-we can try something else.    Okay to take famotidine  40 mg daily.    Thank you

## 2024-06-27 ENCOUNTER — Other Ambulatory Visit: Payer: Self-pay | Admitting: Obstetrics

## 2024-06-27 DIAGNOSIS — Z1231 Encounter for screening mammogram for malignant neoplasm of breast: Secondary | ICD-10-CM

## 2024-07-20 ENCOUNTER — Other Ambulatory Visit: Payer: Self-pay | Admitting: Internal Medicine

## 2024-08-10 ENCOUNTER — Ambulatory Visit
Admission: RE | Admit: 2024-08-10 | Discharge: 2024-08-10 | Disposition: A | Source: Ambulatory Visit | Attending: Obstetrics

## 2024-08-10 DIAGNOSIS — Z1231 Encounter for screening mammogram for malignant neoplasm of breast: Secondary | ICD-10-CM

## 2024-08-28 ENCOUNTER — Encounter (HOSPITAL_BASED_OUTPATIENT_CLINIC_OR_DEPARTMENT_OTHER): Payer: Self-pay

## 2024-08-28 ENCOUNTER — Ambulatory Visit (HOSPITAL_BASED_OUTPATIENT_CLINIC_OR_DEPARTMENT_OTHER)
Admission: EM | Admit: 2024-08-28 | Discharge: 2024-08-28 | Disposition: A | Discharge status: Home / Self Care | Attending: Family Medicine | Admitting: Family Medicine

## 2024-08-28 DIAGNOSIS — J0110 Acute frontal sinusitis, unspecified: Secondary | ICD-10-CM

## 2024-08-28 MED ORDER — AZITHROMYCIN 250 MG PO TABS: 250.0000 mg | ORAL_TABLET | Freq: Every day | ORAL | 0 refills | Status: AC

## 2024-08-28 NOTE — ED Triage Notes (Signed)
 States feeling significant pressure/discomfort to sinuses. No fever, no sore throat. I'm having a lot of drainage.  Symptoms x 3 days.

## 2024-08-28 NOTE — Discharge Instructions (Signed)
 Continue with Flonase  and Zyrtec. Giving you some antibiotics for sinus infection. You can do Mucinex OTC

## 2024-08-28 NOTE — ED Provider Notes (Signed)
 " [facility]    CSN: [id number] Arrival date & time: 08/28/24  1001      History   Chief Complaint Chief Complaint  Patient presents with   Sinus pressure    HPI Quetzali Heinle Sopko is a 73 y.o. female.   Patient is a 73 year old female presents today with sinus congestion, rhinorrhea.  States feeling significant pressure/discomfort to sinuses. No fever, no sore throat. Symptoms x 3 days.Takes antihistamines. No fever. This is recurrent for her during weather  changes.       Past Medical History:  Diagnosis Date   Allergic rhinitis    Anemia    NOS   Asthma    Carotid stenosis    mild   GERD (gastroesophageal reflux disease)    Dr. Abran   Hyperlipidemia    Migraine    Osteopenia     Patient Active Problem List   Diagnosis Date Noted   Mild persistent asthma without complication 05/02/2024   Vasomotor rhinitis 05/02/2024   Hip pain 12/28/2023   BPV (benign positional vertigo) 04/21/2022   Tick bite 01/09/2021   Malaise 01/09/2021   Acute sinusitis 08/16/2020   Elevated coronary artery calcium  score 06/30/2019   Coronary atherosclerosis due to lipid rich plaque 06/10/2019   Laryngitis 07/22/2018   Finger laceration 07/13/2018   Hypercholesterolemia 11/16/2017   Osteopenia 11/16/2017   Thoracic back pain 11/14/2014   Seromucinous otitis media 07/03/2011   HTN (hypertension) 07/03/2011   Well adult exam 02/27/2011   CONSTIPATION, CHRONIC 10/28/2010   NAUSEA 10/28/2010   ABDOMINAL PAIN, EPIGASTRIC 10/28/2010   BRONCHITIS, ACUTE 10/16/2009   URI, acute 07/21/2008   VERTIGO 07/21/2008   CONTACT DERMATITIS&OTHER ECZEMA DUE UNSPEC CAUSE 06/15/2008   OTHER SPECIFIED PRURITIC CONDITIONS 06/15/2008   INSOMNIA 05/08/2008   BURN OF UNSPECIFIED DEGREE OF PALM OF HAND 05/08/2008   CERVICALGIA 03/30/2008   HEMORRHOIDS 11/03/2007   ESOPHAGEAL STRICTURE 11/03/2007   OSTEOARTHRITIS 11/03/2007   HEADACHE 11/03/2007   ROTATOR CUFF SPRAIN AND STRAIN  11/03/2007   Unspecified otitis media 10/08/2007   ANEMIA-NOS 10/04/2007   ALLERGIC RHINITIS 10/04/2007   GERD 08/31/2007   Dyslipidemia 07/27/2007   CAROTID ARTERY STENOSIS 07/27/2007   Asthma 07/27/2007   OSTEOPENIA 07/27/2007    Past Surgical History:  Procedure Laterality Date   TUBAL LIGATION      OB History   No obstetric history on file.      Home Medications    Prior to Admission medications  Medication Sig Start Date End Date Taking? Authorizing Provider  azithromycin  (ZITHROMAX ) 250 MG tablet Take 1 tablet (250 mg total) by mouth daily. Take first 2 tablets together, then 1 every day until finished. 08/28/24  Yes Afnan Emberton A, FNP  albuterol (VENTOLIN HFA) 108 (90 Base) MCG/ACT inhaler Inhale 1-2 puffs into the lungs every 6 (six) hours as needed for wheezing or shortness of breath.    [provider]  b complex vitamins tablet Take 1 tablet by mouth daily. 04/12/19   Plotnikov, Karlynn GAILS, MD  BREO ELLIPTA 200-25 MCG/INH AEPB Inhale 1 puff into the lungs daily. 02/15/16   [provider]  Cholecalciferol (VITAMIN D3) 50 MCG (2000 UT) capsule Take 1 capsule (2,000 Units total) by mouth daily. 04/17/20   Plotnikov, Karlynn GAILS, MD  famotidine  (PEPCID ) 40 MG tablet Take 1 tablet (40 mg total) by mouth daily. 06/13/24   Plotnikov, Aleksei V, MD  fluticasone  (FLONASE ) 50 MCG/ACT nasal spray Place 1 spray into both nostrils daily.  09/03/23   Enedelia Dorna HERO, FNP  hydroxychloroquine  (PLAQUENIL ) 200 MG tablet Take 400 mg bid x 2 days, then 200 mg daily for 5 days 04/28/23   Plotnikov, Aleksei V, MD  loratadine (CLARITIN) 10 MG tablet Take 10 mg by mouth daily.      [provider]  losartan  (COZAAR ) 50 MG tablet TAKE 1 TABLET (50 MG TOTAL) BY MOUTH DAILY. 04/22/24   Plotnikov, Karlynn GAILS, MD  Spacer/Aero-Holding Chambers (AEROCHAMBER MV) inhaler as directed inhalation prn; Duration: 30 days    [provider]    Family History Family History   Problem Relation Age of Onset   Hypertension Mother    Cancer Father 70       Chest   Breast cancer Neg Hx     Social History Social History[1]   Allergies   Amoxicillin -pot clavulanate, Alendronate sodium, Lipitor [atorvastatin ], and Lovastatin    Review of Systems Review of Systems See HPI  Physical Exam Triage Vital Signs ED Triage Vitals  Encounter Vitals Group     BP 08/28/24 1054 131/70     Girls Systolic BP Percentile --      Girls Diastolic BP Percentile --      Boys Systolic BP Percentile --      Boys Diastolic BP Percentile --      Pulse Rate 08/28/24 1054 80     Resp 08/28/24 1054 20     Temp 08/28/24 1054 98.5 F (36.9 C)     Temp Source 08/28/24 1054 Oral     SpO2 08/28/24 1054 96 %     Weight --      Height --      Head Circumference --      Peak Flow --      Pain Score 08/28/24 1055 8     Pain Loc --      Pain Education --      Exclude from Growth Chart --    No data found.  Updated Vital Signs BP 131/70 (BP Location: Right Arm)   Pulse 80   Temp 98.5 F (36.9 C) (Oral)   Resp 20   SpO2 96%   Visual Acuity Right Eye Distance:   Left Eye Distance:   Bilateral Distance:    Right Eye Near:   Left Eye Near:    Bilateral Near:     Physical Exam Constitutional:      General: She is not in acute distress.    Appearance: Normal appearance. She is not ill-appearing, toxic-appearing or diaphoretic.  HENT:     Head: Normocephalic and atraumatic.     Right Ear: Tympanic membrane and ear canal normal.     Left Ear: Tympanic membrane and ear canal normal.     Nose: Congestion and rhinorrhea present.     Mouth/Throat:     Pharynx: Oropharynx is clear.  Eyes:     Conjunctiva/sclera: Conjunctivae normal.  Cardiovascular:     Rate and Rhythm: Normal rate and regular rhythm.     Pulses: Normal pulses.     Heart sounds: Normal heart sounds.  Pulmonary:     Effort: Pulmonary effort is normal.     Breath sounds: Normal breath sounds.   Skin:    General: Skin is warm and dry.  Neurological:     Mental Status: She is alert.  Psychiatric:        Mood and Affect: Mood normal.      UC Treatments / Results  Labs (all labs ordered  are listed, but only abnormal results are displayed) Labs Reviewed - No data to display  EKG   Radiology No results found.  Procedures Procedures (including critical care time)  Medications Ordered in UC Medications - No data to display  Initial Impression / Assessment and Plan / UC Course  I have reviewed the triage vital signs and the nursing notes.  Pertinent labs & imaging results that were available during my care of the patient were reviewed by me and considered in my medical decision making (see chart for details).     Acute sinusitis- This is recurrent for her. Covid and Flu Negative. Most likely sinus infection. Medications as prescribed. Flonase  and Zyrtec OTC. Mucinex for congestion. Follow up as needed  Final Clinical Impressions(s) / UC Diagnoses   Final diagnoses:  Acute non-recurrent frontal sinusitis     Discharge Instructions      Continue with Flonase  and Zyrtec. Giving you some antibiotics for sinus infection. You can do Mucinex OTC      ED Prescriptions     Medication Sig Dispense Auth. Provider   azithromycin  (ZITHROMAX ) 250 MG tablet Take 1 tablet (250 mg total) by mouth daily. Take first 2 tablets together, then 1 every day until finished. 6 tablet Adah Wilbert LABOR, FNP      PDMP not reviewed this encounter.     [1]  Social History Tobacco Use   Smoking status: Never   Smokeless tobacco: Never  Substance Use Topics   Alcohol use: No   Drug use: No     Adah Wilbert LABOR, FNP 08/28/24 1221  "

## 2024-08-29 ENCOUNTER — Ambulatory Visit (HOSPITAL_BASED_OUTPATIENT_CLINIC_OR_DEPARTMENT_OTHER)

## 2024-09-19 ENCOUNTER — Encounter: Payer: Self-pay | Admitting: Internal Medicine

## 2024-09-19 ENCOUNTER — Ambulatory Visit: Admitting: Internal Medicine

## 2024-09-19 VITALS — Ht 66.0 in

## 2024-09-19 DIAGNOSIS — E7800 Pure hypercholesterolemia, unspecified: Secondary | ICD-10-CM

## 2024-09-19 DIAGNOSIS — H6593 Unspecified nonsuppurative otitis media, bilateral: Secondary | ICD-10-CM | POA: Diagnosis not present

## 2024-09-19 MED ORDER — ATORVASTATIN CALCIUM 10 MG PO TABS: 10.0000 mg | ORAL_TABLET | Freq: Every day | ORAL | 3 refills | Status: AC

## 2024-09-19 MED ORDER — FLUCONAZOLE 150 MG PO TABS: 150.0000 mg | ORAL_TABLET | Freq: Once | ORAL | 0 refills | Status: AC

## 2024-09-19 MED ORDER — CEFDINIR 300 MG PO CAPS: 300.0000 mg | ORAL_CAPSULE | Freq: Two times a day (BID) | ORAL | 0 refills | Status: AC

## 2024-09-19 NOTE — Assessment & Plan Note (Signed)
 New Omnicef  x 10 d Diflucan  prn Flonase 

## 2024-09-19 NOTE — Assessment & Plan Note (Signed)
 Re-start Lipitor 10 mg a day. No HAs on 10 mg/d

## 2024-09-19 NOTE — Progress Notes (Signed)
 "  Subjective:  Patient ID: [name] [name], female    DOB: [birth year]  Age: 74 y.o. MRN: [id number]  CC: Ear Fullness (Nasal congestion and ear fullness for several weeks. Recently prescribed azithromycin  mid-December)   HPI [name] [name] presents for Ear Fullness R>L (Nasal congestion and ear fullness for several weeks. Recently prescribed azithromycin  mid-December). Not better F/u on dyslipidemia  Outpatient Medications Prior to Visit  Medication Sig Dispense Refill   albuterol (VENTOLIN HFA) 108 (90 Base) MCG/ACT inhaler Inhale 1-2 puffs into the lungs every 6 (six) hours as needed for wheezing or shortness of breath.     azithromycin  (ZITHROMAX ) 250 MG tablet Take 1 tablet (250 mg total) by mouth daily. Take first 2 tablets together, then 1 every day until finished. 6 tablet 0   b complex vitamins tablet Take 1 tablet by mouth daily. 100 tablet 3   BREO ELLIPTA 200-25 MCG/INH AEPB Inhale 1 puff into the lungs daily.  6   Cholecalciferol (VITAMIN D3) 50 MCG (2000 UT) capsule Take 1 capsule (2,000 Units total) by mouth daily. 100 capsule 3   famotidine  (PEPCID ) 40 MG tablet Take 1 tablet (40 mg total) by mouth daily. 90 tablet 3   fluticasone  (FLONASE ) 50 MCG/ACT nasal spray Place 1 spray into both nostrils daily. 16 g 0   hydroxychloroquine  (PLAQUENIL ) 200 MG tablet Take 400 mg bid x 2 days, then 200 mg daily for 5 days 16 tablet 0   loratadine (CLARITIN) 10 MG tablet Take 10 mg by mouth daily.       losartan  (COZAAR ) 50 MG tablet TAKE 1 TABLET (50 MG TOTAL) BY MOUTH DAILY. 90 tablet 3   Spacer/Aero-Holding Chambers (AEROCHAMBER MV) inhaler as directed inhalation prn; Duration: 30 days     No facility-administered medications prior to visit.    ROS: Review of Systems  Constitutional:  Negative for activity change, appetite change, chills, fatigue and unexpected weight change.  HENT:  Positive for congestion and ear pain. Negative for mouth sores and sinus  pressure.   Eyes:  Negative for visual disturbance.  Respiratory:  Negative for cough and chest tightness.   Gastrointestinal:  Negative for abdominal pain and nausea.  Genitourinary:  Negative for difficulty urinating, frequency and vaginal pain.  Musculoskeletal:  Negative for back pain and gait problem.  Skin:  Negative for pallor and rash.  Neurological:  Negative for dizziness, tremors, weakness, numbness and headaches.  Psychiatric/Behavioral:  Negative for confusion and sleep disturbance.     Objective:  Ht 5' 6 (1.676 m)   BMI 25.02 kg/m   BP Readings from Last 3 Encounters:  08/28/24 131/70  05/02/24 138/81  12/28/23 118/82    Wt Readings from Last 3 Encounters:  05/02/24 155 lb (70.3 kg)  12/28/23 156 lb 12.8 oz (71.1 kg)  09/03/23 155 lb (70.3 kg)    Physical Exam Constitutional:      General: She is not in acute distress.    Appearance: Normal appearance. She is well-developed.  HENT:     Head: Normocephalic.     Right Ear: External ear normal.     Left Ear: External ear normal.     Nose: Nose normal.  Eyes:     General:        Right eye: No discharge.        Left eye: No discharge.     Conjunctiva/sclera: Conjunctivae normal.     Pupils: Pupils are equal, round, and reactive to light.  Neck:  Thyroid : No thyromegaly.     Vascular: No JVD.     Trachea: No tracheal deviation.  Cardiovascular:     Rate and Rhythm: Normal rate and regular rhythm.     Heart sounds: Normal heart sounds.  Pulmonary:     Effort: No respiratory distress.     Breath sounds: No stridor. No wheezing.  Abdominal:     General: Bowel sounds are normal. There is no distension.     Palpations: Abdomen is soft. There is no mass.     Tenderness: There is no abdominal tenderness. There is no guarding or rebound.  Musculoskeletal:        General: No tenderness.     Cervical back: Normal range of motion and neck supple. No rigidity.  Lymphadenopathy:     Cervical: No cervical  adenopathy.  Skin:    Findings: No erythema or rash.  Neurological:     Cranial Nerves: No cranial nerve deficit.     Motor: No abnormal muscle tone.     Coordination: Coordination normal.     Deep Tendon Reflexes: Reflexes normal.  Psychiatric:        Behavior: Behavior normal.        Thought Content: Thought content normal.        Judgment: Judgment normal.   R>L TM eythema Dry throat  Lab Results  Component Value Date   WBC 5.8 06/02/2024   HGB 13.5 06/02/2024   HCT 40.5 06/02/2024   PLT 314.0 06/02/2024   GLUCOSE 96 06/02/2024   CHOL 161 06/02/2024   TRIG 68.0 06/02/2024   HDL 70.90 06/02/2024   LDLCALC 76 06/02/2024   ALT 18 06/02/2024   AST 19 06/02/2024   NA 141 06/02/2024   K 4.0 06/02/2024   CL 105 06/02/2024   CREATININE 0.70 06/02/2024   BUN 15 06/02/2024   CO2 27 06/02/2024   TSH 1.73 06/02/2024    No results found.  Assessment & Plan:   Problem List Items Addressed This Visit     Unspecified otitis media - Primary   New Omnicef  x 10 d Diflucan  prn Flonase       Relevant Medications   cefdinir  (OMNICEF ) 300 MG capsule   fluconazole  (DIFLUCAN ) 150 MG tablet   Hypercholesterolemia   Re-start Lipitor 10 mg a day. No HAs on 10 mg/d      Relevant Medications   atorvastatin  (LIPITOR) 10 MG tablet      Meds ordered this encounter  Medications   cefdinir  (OMNICEF ) 300 MG capsule    Sig: Take 1 capsule (300 mg total) by mouth 2 (two) times daily.    Dispense:  20 capsule    Refill:  0   fluconazole  (DIFLUCAN ) 150 MG tablet    Sig: Take 1 tablet (150 mg total) by mouth once for 1 dose.    Dispense:  1 tablet    Refill:  0   atorvastatin  (LIPITOR) 10 MG tablet    Sig: Take 1 tablet (10 mg total) by mouth daily.    Dispense:  90 tablet    Refill:  3    No headaches on 10 mg/d      Follow-up: Return for a follow-up visit.  Marolyn Noel, MD "

## 2025-05-03 ENCOUNTER — Encounter: Admitting: Internal Medicine
